# Patient Record
Sex: Female | Born: 1995
Health system: Southern US, Community
[De-identification: ages and names within clinical notes are randomized; demographics above are authoritative.]

## PROBLEM LIST (undated history)

## (undated) DIAGNOSIS — G43009 Migraine without aura, not intractable, without status migrainosus: Secondary | ICD-10-CM

## (undated) DIAGNOSIS — N926 Irregular menstruation, unspecified: Secondary | ICD-10-CM

## (undated) DIAGNOSIS — T7840XA Allergy, unspecified, initial encounter: Secondary | ICD-10-CM

## (undated) HISTORY — DX: Allergy, unspecified, initial encounter: T78.40XA

## (undated) HISTORY — DX: Migraine without aura, not intractable, without status migrainosus: G43.009

## (undated) HISTORY — PX: MOLE REMOVAL: SHX2046

## (undated) HISTORY — PX: COSMETIC SURGERY: SHX468

## (undated) HISTORY — DX: Irregular menstruation, unspecified: N92.6

---
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
## 2013-01-01 ENCOUNTER — Ambulatory Visit (INDEPENDENT_AMBULATORY_CARE_PROVIDER_SITE_OTHER): Payer: BC Managed Care – PPO | Admitting: Family Medicine

## 2013-01-01 VITALS — BP 128/74 | HR 122 | Temp 99.8°F | Resp 18 | Ht 65.5 in | Wt 150.6 lb

## 2013-01-01 DIAGNOSIS — R509 Fever, unspecified: Secondary | ICD-10-CM

## 2013-01-01 DIAGNOSIS — J029 Acute pharyngitis, unspecified: Secondary | ICD-10-CM

## 2013-01-01 MED ORDER — PENICILLIN V POTASSIUM 500 MG PO TABS: 500.0000 mg | ORAL_TABLET | Freq: Four times a day (QID) | ORAL | Status: DC

## 2013-01-01 NOTE — Progress Notes (Signed)
This is an 17 year old woman who just finished her first year at [employer]. She comes in with persistent sore throat for 5 days. She was originally seen [facility] the infirm[facility] college and a rapid strep was negative. Nevertheless, the sore throat continued and now she is running a fever over 100 and she vomited earlier today.  Patient has a significant past medical history of mono several years ago.  Patient has myalgia but no stiff neck, cough  Associated symptoms are mild headache, myalgia, one episode of vomiting, and loose stools  Objective: No acute distress HEENT: Unremarkable except for the red and mildly swollen throat, no exudates. Neck: Supple with enlarged bilateral anterior cervical nodes Chest: Clear Heart: Regular no murmur Skin: No rash  Assessment: Examination of swollen glands, fever, and reddened sore throat with a history in the past of mono, strongly suggests strep infection   Plan:Fever - Plan: penicillin v potassium (VEETID) 500 MG tablet, DISCONTINUED: penicillin v potassium (VEETID) 500 MG tablet  Sore throat - Plan: penicillin v potassium (VEETID) 500 MG tablet, DISCONTINUED: penicillin v potassium (VEETID) 500 MG tablet  Signed, Elvina Sidle, MD

## 2013-02-17 ENCOUNTER — Ambulatory Visit (INDEPENDENT_AMBULATORY_CARE_PROVIDER_SITE_OTHER): Payer: BC Managed Care – PPO | Admitting: Obstetrics & Gynecology

## 2013-02-17 ENCOUNTER — Ambulatory Visit: Payer: BC Managed Care – PPO

## 2013-02-17 ENCOUNTER — Encounter: Payer: Self-pay | Admitting: Obstetrics & Gynecology

## 2013-02-17 VITALS — BP 102/68 | HR 70 | Resp 16 | Ht 66.0 in | Wt 153.0 lb

## 2013-02-17 DIAGNOSIS — Z3040 Encounter for surveillance of contraceptives, unspecified: Secondary | ICD-10-CM

## 2013-02-17 MED ORDER — MEDROXYPROGESTERONE ACETATE 150 MG/ML IM SUSP
150.0000 mg | Freq: Once | INTRAMUSCULAR | Status: AC
  Administered 2013-02-17: 150 mg via INTRAMUSCULAR

## 2013-02-17 NOTE — Progress Notes (Signed)
Pt. Tolerated medication well informed to return in 12 weeks on or about 9-8 to 9-22.  AEX 12/13.

## 2013-04-20 ENCOUNTER — Ambulatory Visit (INDEPENDENT_AMBULATORY_CARE_PROVIDER_SITE_OTHER): Payer: BC Managed Care – PPO | Admitting: Family Medicine

## 2013-04-20 VITALS — BP 109/72 | HR 64 | Temp 98.2°F | Resp 16 | Ht 65.0 in | Wt 151.4 lb

## 2013-04-20 DIAGNOSIS — R300 Dysuria: Secondary | ICD-10-CM

## 2013-04-20 LAB — POCT URINALYSIS DIPSTICK
Ketones, UA: NEGATIVE
Protein, UA: NEGATIVE
Spec Grav, UA: 1.01
Urobilinogen, UA: 0.2

## 2013-04-20 LAB — POCT UA - MICROSCOPIC ONLY
Crystals, Ur, HPF, POC: NEGATIVE
Mucus, UA: NEGATIVE

## 2013-04-20 MED ORDER — NITROFURANTOIN MONOHYD MACRO 100 MG PO CAPS: 100.0000 mg | ORAL_CAPSULE | Freq: Two times a day (BID) | ORAL | Status: DC

## 2013-04-20 NOTE — Progress Notes (Signed)
Subjective:    Patient ID: Laura Andrews, female    DOB: 22-Mar-1996, 17 y.o.   MRN: [id number] Chief Complaint  Patient presents with  . Urinary Frequency    started this morning    HPI  Having a lot of dysuria and urinary frequency and urgency. No f/c, no n/v, no back pain, no c/d.  Has had a few UTIs prior but about a year since prior.   Is a swimmer and is sexually active.  No past medical history on file. Current Outpatient Prescriptions on File Prior to Visit  Medication Sig Dispense Refill  . medroxyPROGESTERone (DEPO-PROVERA) 150 MG/ML injection Inject 150 mg into the muscle every 3 (three) months.       No current facility-administered medications on file prior to visit.   Allergies  Allergen Reactions  . Omnicef [Cefdinir]     Colitis   . Latex Rash   Review of Systems  Constitutional: Negative for fever, chills, diaphoresis, activity change, appetite change, fatigue and unexpected weight change.  Gastrointestinal: Negative for abdominal pain, diarrhea, constipation, blood in stool, anal bleeding and rectal pain.  Genitourinary: Positive for dysuria, urgency and frequency. Negative for hematuria, flank pain, decreased urine volume, vaginal bleeding, vaginal discharge, enuresis, difficulty urinating, genital sores, vaginal pain, menstrual problem, pelvic pain and dyspareunia.  Musculoskeletal: Negative for gait problem.  Skin: Negative for rash.  Hematological: Negative for adenopathy.  Psychiatric/Behavioral: The patient is not nervous/anxious.       BP 109/72  Pulse 64  Temp(Src) 98.2 F (36.8 C) (Oral)  Resp 16  Ht 5\' 5"  (1.651 m)  Wt 151 lb 6.4 oz (68.675 kg)  BMI 25.19 kg/m2  SpO2 99% Objective:   Physical Exam  Constitutional: She is oriented to person, place, and time. She appears well-developed and well-nourished. No distress.  HENT:  Head: Normocephalic and atraumatic.  Cardiovascular: Normal rate, regular rhythm, normal heart sounds and intact distal  pulses.   Pulmonary/Chest: Effort normal and breath sounds normal.  Abdominal: Soft. Bowel sounds are normal. She exhibits no distension. There is no hepatosplenomegaly. There is no tenderness. There is no rebound, no guarding and no CVA tenderness.  Neurological: She is alert and oriented to person, place, and time.  Skin: Skin is warm and dry. She is not diaphoretic.  Psychiatric: She has a normal mood and affect. Her behavior is normal.           Results for orders placed in visit on 04/20/13  POCT URINE PREGNANCY      Result Value Range   Preg Test, Ur Negative    POCT URINALYSIS DIPSTICK      Result Value Range   Color, UA yellow     Clarity, UA clear     Glucose, UA neg     Bilirubin, UA neg     Ketones, UA neg     Spec Grav, UA 1.010     Blood, UA trace     pH, UA 7.0     Protein, UA neg     Urobilinogen, UA 0.2     Nitrite, UA neg     Leukocytes, UA small (1+)    POCT UA - MICROSCOPIC ONLY      Result Value Range   WBC, Ur, HPF, POC tntc     RBC, urine, microscopic 2-6     Bacteria, U Microscopic 1+     Mucus, UA neg     Epithelial cells, urine per micros 3-4  Crystals, Ur, HPF, POC neg     Casts, Ur, LPF, POC neg     Yeast, UA neg      Assessment & Plan:  Dysuria - Plan: POCT urine pregnancy, POCT urinalysis dipstick, POCT UA - Microscopic Only, Urine culture  Meds ordered this encounter  Medications  . nitrofurantoin, macrocrystal-monohydrate, (MACROBID) 100 MG capsule    Sig: Take 1 capsule (100 mg total) by mouth 2 (two) times daily.    Dispense:  14 capsule    Refill:  0

## 2013-04-20 NOTE — Patient Instructions (Addendum)
Urinary Tract Infection  Urinary tract infections (UTIs) can develop anywhere along your urinary tract. Your urinary tract is your body's drainage system for removing wastes and extra water. Your urinary tract includes two kidneys, two ureters, a bladder, and a urethra. Your kidneys are a pair of bean-shaped organs. Each kidney is about the size of your fist. They are located below your ribs, one on each side of your spine.  CAUSES  Infections are caused by microbes, which are microscopic organisms, including fungi, viruses, and bacteria. These organisms are so small that they can only be seen through a microscope. Bacteria are the microbes that most commonly cause UTIs.  SYMPTOMS   Symptoms of UTIs may vary by age and gender of the patient and by the location of the infection. Symptoms in young women typically include a frequent and intense urge to urinate and a painful, burning feeling in the bladder or urethra during urination. Older women and men are more likely to be tired, shaky, and weak and have muscle aches and abdominal pain. A fever may mean the infection is in your kidneys. Other symptoms of a kidney infection include pain in your back or sides below the ribs, nausea, and vomiting.  DIAGNOSIS  To diagnose a UTI, your caregiver will ask you about your symptoms. Your caregiver also will ask to provide a urine sample. The urine sample will be tested for bacteria and white blood cells. White blood cells are made by your body to help fight infection.  TREATMENT   Typically, UTIs can be treated with medication. Because most UTIs are caused by a bacterial infection, they usually can be treated with the use of antibiotics. The choice of antibiotic and length of treatment depend on your symptoms and the type of bacteria causing your infection.  HOME CARE INSTRUCTIONS   If you were prescribed antibiotics, take them exactly as your caregiver instructs you. Finish the medication even if you feel better after you  have only taken some of the medication.   Drink enough water and fluids to keep your urine clear or pale yellow.   Avoid caffeine, tea, and carbonated beverages. They tend to irritate your bladder.   Empty your bladder often. Avoid holding urine for long periods of time.   Empty your bladder before and after sexual intercourse.   After a bowel movement, women should cleanse from front to back. Use each tissue only once.  SEEK MEDICAL CARE IF:    You have back pain.   You develop a fever.   Your symptoms do not begin to resolve within 3 days.  SEEK IMMEDIATE MEDICAL CARE IF:    You have severe back pain or lower abdominal pain.   You develop chills.   You have nausea or vomiting.   You have continued burning or discomfort with urination.  MAKE SURE YOU:    Understand these instructions.   Will watch your condition.   Will get help right away if you are not doing well or get worse.  Document Released: 05/24/2005 Document Revised: 02/13/2012 Document Reviewed: 09/22/2011  [employer] Patient Information 2014 [employer].

## 2013-04-22 LAB — URINE CULTURE: Colony Count: 50000

## 2013-04-23 ENCOUNTER — Telehealth: Payer: Self-pay

## 2013-04-23 NOTE — Telephone Encounter (Signed)
Pts mom came by office to say(child at college)that her daughter is taking antibiotic for uti, and has now developed severe diarrhea with mucous,please  Advise.   Best phone for pt is [phone]   Pharmacy [facility] [place]

## 2013-04-24 NOTE — Telephone Encounter (Signed)
She is on macrobid. Lab results: Your urine culture returned actually NOT showing an infection. This could have been because your symptoms just started on the day you were seen. If you continue to have any urine/pelvic symptoms at all, please return to clinic for further eval. Discussed with Dr Clelia Croft and patient may d/c the antibiotic. She should return to clinic if urinary symptoms return. She should push fluids and rest. Patient advised.

## 2013-05-05 ENCOUNTER — Ambulatory Visit (INDEPENDENT_AMBULATORY_CARE_PROVIDER_SITE_OTHER): Payer: BC Managed Care – PPO

## 2013-05-05 VITALS — BP 94/60 | HR 60 | Resp 16 | Ht 65.0 in | Wt 155.0 lb

## 2013-05-05 DIAGNOSIS — Z3040 Encounter for surveillance of contraceptives, unspecified: Secondary | ICD-10-CM

## 2013-05-05 MED ORDER — MEDROXYPROGESTERONE ACETATE 150 MG/ML IM SUSP
150.0000 mg | Freq: Once | INTRAMUSCULAR | Status: AC
  Administered 2013-05-05: 150 mg via INTRAMUSCULAR

## 2013-05-05 NOTE — Progress Notes (Signed)
Pt tolerated injection well. Pt advised to RTO between 07-21-13 & 08-04-13 for next depo. Pt has aex set up for 1/15. Pt brought her own depo today

## 2013-05-14 ENCOUNTER — Ambulatory Visit (INDEPENDENT_AMBULATORY_CARE_PROVIDER_SITE_OTHER): Payer: BC Managed Care – PPO | Admitting: Physician Assistant

## 2013-05-14 VITALS — BP 110/70 | HR 81 | Temp 98.6°F | Resp 16 | Ht 66.0 in | Wt 151.2 lb

## 2013-05-14 DIAGNOSIS — N898 Other specified noninflammatory disorders of vagina: Secondary | ICD-10-CM

## 2013-05-14 DIAGNOSIS — B373 Candidiasis of vulva and vagina: Secondary | ICD-10-CM

## 2013-05-14 DIAGNOSIS — L293 Anogenital pruritus, unspecified: Secondary | ICD-10-CM

## 2013-05-14 LAB — POCT WET PREP WITH KOH
Clue Cells Wet Prep HPF POC: NEGATIVE
KOH Prep POC: POSITIVE
RBC Wet Prep HPF POC: NEGATIVE
Trichomonas, UA: NEGATIVE
Yeast Wet Prep HPF POC: NEGATIVE

## 2013-05-14 MED ORDER — FLUCONAZOLE 150 MG PO TABS: 150.0000 mg | ORAL_TABLET | Freq: Once | ORAL | Status: DC

## 2013-05-14 NOTE — Progress Notes (Signed)
  Subjective:    Patient ID: Laura Andrews, female    DOB: 24-Jul-1996, 17 y.o.   MRN: [id number]  HPI   Ms. Mangus is a very pleasant 17 yr old female here with concern for yeast infection.  Has had these before though it has been a couple years.  Has experienced about 3 days of vaginal itching, noted some increased discharge today.  No odor, no skin irritation.  Tried some vagisil to help relieve symptoms but has not tried otc treatment.  Was here about 2 wks ago for UTI, took course of macrobid, had significant diarrhea.  Urine culture subsequently had limited growth and urinary symptoms have resolved.  Pt states she has been very sensitive to antibiotics in the past - frequently severe GI SEs as well as yeast infections.  LMP 2012 -due to depo.  Sexually active with 1 female partner.  No concern for STIs, declines testing today.  Last tested Dec 2013.  Uses condoms about 75% of the time.  Denies dyspareunia or abnormal bleeding.    Review of Systems  Constitutional: Negative for fever and chills.  HENT: Negative.   Respiratory: Negative.   Cardiovascular: Negative.   Gastrointestinal: Negative.   Genitourinary: Positive for vaginal discharge. Negative for dysuria, urgency and frequency.  Musculoskeletal: Negative.   Skin: Negative.   Neurological: Negative.        Objective:   Physical Exam  Vitals reviewed. Constitutional: She is oriented to person, place, and time. She appears well-developed and well-nourished. No distress.  HENT:  Head: Normocephalic and atraumatic.  Eyes: Conjunctivae are normal. No scleral icterus.  Cardiovascular: Normal rate, regular rhythm and normal heart sounds.   Pulmonary/Chest: Effort normal. She has no wheezes. She has no rales.  Abdominal: Soft. Bowel sounds are normal. There is no tenderness.  Genitourinary: There is no rash, tenderness or lesion on the right labia. There is no rash, tenderness or lesion on the left labia. Right adnexum displays no mass,  no tenderness and no fullness. Left adnexum displays no mass, no tenderness and no fullness. Vaginal discharge found.  Neurological: She is alert and oriented to person, place, and time.  Skin: Skin is warm and dry.  Psychiatric: She has a normal mood and affect. Her behavior is normal.    Results for orders placed in visit on 05/14/13  POCT WET PREP WITH KOH      Result Value Range   Trichomonas, UA Negative     Clue Cells Wet Prep HPF POC neg     Epithelial Wet Prep HPF POC 0-1     Yeast Wet Prep HPF POC neg     Bacteria Wet Prep HPF POC 1+     RBC Wet Prep HPF POC neg     WBC Wet Prep HPF POC 0-2     KOH Prep POC Positive           Assessment & Plan:  Vaginal candidiasis  Vaginal itching - Plan: POCT Wet Prep with KOH   Ms. Dannenberg is a very pleasant 17 yr old female here with vaginal candidiasis.  Will treat with fluconazole 150mg  x 1, may repeat in 1 wk if still symptomatic.  RTC if worsening or not improving.

## 2013-05-14 NOTE — Patient Instructions (Signed)
Monilial Vaginitis  Vaginitis in a soreness, swelling and redness (inflammation) of the vagina and vulva. Monilial vaginitis is not a sexually transmitted infection.  CAUSES   Yeast vaginitis is caused by yeast (candida) that is normally found in your vagina. With a yeast infection, the candida has overgrown in number to a point that upsets the chemical balance.  SYMPTOMS   · White, thick vaginal discharge.  · Swelling, itching, redness and irritation of the vagina and possibly the lips of the vagina (vulva).  · Burning or painful urination.  · Painful intercourse.  DIAGNOSIS   Things that may contribute to monilial vaginitis are:  · Postmenopausal and virginal states.  · Pregnancy.  · Infections.  · Being tired, sick or stressed, especially if you had monilial vaginitis in the past.  · Diabetes. Good control will help lower the chance.  · Birth control pills.  · Tight fitting garments.  · Using bubble bath, feminine sprays, douches or deodorant tampons.  · Taking certain medications that kill germs (antibiotics).  · Sporadic recurrence can occur if you become ill.  TREATMENT   Your caregiver will give you medication.  · There are several kinds of anti monilial vaginal creams and suppositories specific for monilial vaginitis. For recurrent yeast infections, use a suppository or cream in the vagina 2 times a week, or as directed.  · Anti-monilial or steroid cream for the itching or irritation of the vulva may also be used. Get your caregiver's permission.  · Painting the vagina with methylene blue solution may help if the monilial cream does not work.  · Eating yogurt may help prevent monilial vaginitis.  HOME CARE INSTRUCTIONS   · Finish all medication as prescribed.  · Do not have sex until treatment is completed or after your caregiver tells you it is okay.  · Take warm sitz baths.  · Do not douche.  · Do not use tampons, especially scented ones.  · Wear cotton underwear.  · Avoid tight pants and panty  hose.  · Tell your sexual partner that you have a yeast infection. They should go to their caregiver if they have symptoms such as mild rash or itching.  · Your sexual partner should be treated as well if your infection is difficult to eliminate.  · Practice safer sex. Use condoms.  · Some vaginal medications cause latex condoms to fail. Vaginal medications that harm condoms are:  · Cleocin cream.  · Butoconazole (Femstat®).  · Terconazole (Terazol®) vaginal suppository.  · Miconazole (Monistat®) (may be purchased over the counter).  SEEK MEDICAL CARE IF:   · You have a temperature by mouth above 102° F (38.9° C).  · The infection is getting worse after 2 days of treatment.  · The infection is not getting better after 3 days of treatment.  · You develop blisters in or around your vagina.  · You develop vaginal bleeding, and it is not your menstrual period.  · You have pain when you urinate.  · You develop intestinal problems.  · You have pain with sexual intercourse.  Document Released: 05/24/2005 Document Revised: 11/06/2011 Document Reviewed: 02/05/2009  [employer]® Patient Information ©2014 [employer].

## 2013-06-17 ENCOUNTER — Other Ambulatory Visit: Payer: Self-pay | Admitting: Orthopedic Surgery

## 2013-06-17 DIAGNOSIS — M25562 Pain in left knee: Secondary | ICD-10-CM

## 2013-06-24 ENCOUNTER — Ambulatory Visit
Admission: RE | Admit: 2013-06-24 | Discharge: 2013-06-24 | Disposition: A | Discharge status: Home / Self Care | Payer: BC Managed Care – PPO | Source: Ambulatory Visit | Attending: Orthopedic Surgery | Admitting: Orthopedic Surgery

## 2013-06-24 DIAGNOSIS — M25562 Pain in left knee: Secondary | ICD-10-CM

## 2013-07-21 ENCOUNTER — Ambulatory Visit: Payer: BC Managed Care – PPO

## 2013-07-21 ENCOUNTER — Ambulatory Visit (INDEPENDENT_AMBULATORY_CARE_PROVIDER_SITE_OTHER): Payer: BC Managed Care – PPO

## 2013-07-21 VITALS — BP 118/68 | HR 60 | Resp 16 | Ht 65.5 in | Wt 148.6 lb

## 2013-07-21 DIAGNOSIS — Z3040 Encounter for surveillance of contraceptives, unspecified: Secondary | ICD-10-CM

## 2013-07-21 MED ORDER — MEDROXYPROGESTERONE ACETATE 150 MG/ML IM SUSP
150.0000 mg | Freq: Once | INTRAMUSCULAR | Status: AC
  Administered 2013-07-21: 150 mg via INTRAMUSCULAR

## 2013-07-21 NOTE — Progress Notes (Signed)
Patient here for depo provera 150mg , next injection due 10/06/13-10/20/13. Has AEX scheduled in 1/15. Patient tolerated well.

## 2013-08-26 ENCOUNTER — Ambulatory Visit: Payer: BC Managed Care – PPO

## 2013-09-02 ENCOUNTER — Ambulatory Visit (INDEPENDENT_AMBULATORY_CARE_PROVIDER_SITE_OTHER): Payer: BC Managed Care – PPO | Admitting: Nurse Practitioner

## 2013-09-02 ENCOUNTER — Encounter: Payer: Self-pay | Admitting: Nurse Practitioner

## 2013-09-02 VITALS — BP 106/72 | HR 72 | Ht 65.0 in | Wt 150.0 lb

## 2013-09-02 DIAGNOSIS — Z0000 Encounter for general adult medical examination without abnormal findings: Secondary | ICD-10-CM

## 2013-09-02 DIAGNOSIS — Z01419 Encounter for gynecological examination (general) (routine) without abnormal findings: Secondary | ICD-10-CM

## 2013-09-02 LAB — HEMOGLOBIN, FINGERSTICK: HEMOGLOBIN, FINGERSTICK: 14.3 g/dL (ref 12.0–16.0)

## 2013-09-02 MED ORDER — MEDROXYPROGESTERONE ACETATE 150 MG/ML IM SUSP: 150.0000 mg | INTRAMUSCULAR | Status: AC

## 2013-09-02 NOTE — Patient Instructions (Signed)
General topics  Next pap or exam is  due in 1 year Take a Women's multivitamin Take 1200 mg. of calcium daily - prefer dietary If any concerns in interim to call back  Breast Self-Awareness Practicing breast self-awareness may pick up problems early, prevent significant medical complications, and possibly save your life. By practicing breast self-awareness, you can become familiar with how your breasts look and feel and if your breasts are changing. This allows you to notice changes early. It can also offer you some reassurance that your breast health is good. One way to learn what is normal for your breasts and whether your breasts are changing is to do a breast self-exam. If you find a lump or something that was not present in the past, it is best to contact your caregiver right away. Other findings that should be evaluated by your caregiver include nipple discharge, especially if it is bloody; skin changes or reddening; areas where the skin seems to be pulled in (retracted); or new lumps and bumps. Breast pain is seldom associated with cancer (malignancy), but should also be evaluated by a caregiver. BREAST SELF-EXAM The best time to examine your breasts is 5 7 days after your menstrual period is over.  ExitCare Patient Information 2013 [employer].   Exercise to Stay Healthy Exercise helps you become and stay healthy. EXERCISE IDEAS AND TIPS Choose exercises that:  You enjoy.  Fit into your day. You do not need to exercise really hard to be healthy. You can do exercises at a slow or medium level and stay healthy. You can:  Stretch before and after working out.  Try yoga, Pilates, or tai chi.  Lift weights.  Walk fast, swim, jog, run, climb stairs, bicycle, dance, or rollerskate.  Take aerobic classes. Exercises that burn about 150 calories:  Running 1  miles in 15 minutes.  Playing volleyball for 45 to 60 minutes.  Washing and waxing a car for 45 to 60  minutes.  Playing touch football for 45 minutes.  Walking 1  miles in 35 minutes.  Pushing a stroller 1  miles in 30 minutes.  Playing basketball for 30 minutes.  Raking leaves for 30 minutes.  Bicycling 5 miles in 30 minutes.  Walking 2 miles in 30 minutes.  Dancing for 30 minutes.  Shoveling snow for 15 minutes.  Swimming laps for 20 minutes.  Walking up stairs for 15 minutes.  Bicycling 4 miles in 15 minutes.  Gardening for 30 to 45 minutes.  Jumping rope for 15 minutes.  Washing windows or floors for 45 to 60 minutes. Document Released: 09/16/2010 Document Revised: 11/06/2011 Document Reviewed: 09/16/2010 ExitCare Patient Information 2013 ExitCare[employer].   Other topics ( that may be useful information):    Sexually Transmitted Disease Sexually transmitted disease (STD) refers to any infection that is passed from person to person during sexual activity. This may happen by way of saliva, semen, blood, vaginal mucus, or urine. Common STDs include:  Gonorrhea.  Chlamydia.  Syphilis.  HIV/AIDS.  Genital herpes.  Hepatitis B and C.  Trichomonas.  Human papillomavirus (HPV).  Pubic lice. CAUSES  An STD may be spread by bacteria, virus, or parasite. A person can get an STD by:  Sexual intercourse with an infected person.  Sharing sex toys with an infected person.  Sharing needles with an infected person.  Having intimate contact with the genitals, mouth, or rectal areas of an infected person. SYMPTOMS  Some people may not have any symptoms, but   they can still pass the infection to others. Different STDs have different symptoms. Symptoms include:  Painful or bloody urination.  Pain in the pelvis, abdomen, vagina, anus, throat, or eyes.  Skin rash, itching, irritation, growths, or sores (lesions). These usually occur in the genital or anal area.  Abnormal vaginal discharge.  Penile discharge in men.  Soft, flesh-colored skin growths in the  genital or anal area.  Fever.  Pain or bleeding during sexual intercourse.  Swollen glands in the groin area.  Yellow skin and eyes (jaundice). This is seen with hepatitis. DIAGNOSIS  To make a diagnosis, your caregiver may:  Take a medical history.  Perform a physical exam.  Take a specimen (culture) to be examined.  Examine a sample of discharge under a microscope.  Perform blood test TREATMENT   Chlamydia, gonorrhea, trichomonas, and syphilis can be cured with antibiotic medicine.  Genital herpes, hepatitis, and HIV can be treated, but not cured, with prescribed medicines. The medicines will lessen the symptoms.  Genital warts from HPV can be treated with medicine or by freezing, burning (electrocautery), or surgery. Warts may come back.  HPV is a virus and cannot be cured with medicine or surgery.However, abnormal areas may be followed very closely by your caregiver and may be removed from the cervix, vagina, or vulva through office procedures or surgery. If your diagnosis is confirmed, your recent sexual partners need treatment. This is true even if they are symptom-free or have a negative culture or evaluation. They should not have sex until their caregiver says it is okay. HOME CARE INSTRUCTIONS  All sexual partners should be informed, tested, and treated for all STDs.  Take your antibiotics as directed. Finish them even if you start to feel better.  Only take over-the-counter or prescription medicines for pain, discomfort, or fever as directed by your caregiver.  Rest.  Eat a balanced diet and drink enough fluids to keep your urine clear or pale yellow.  Do not have sex until treatment is completed and you have followed up with your caregiver. STDs should be checked after treatment.  Keep all follow-up appointments, Pap tests, and blood tests as directed by your caregiver.  Only use latex condoms and water-soluble lubricants during sexual activity. Do not use  petroleum jelly or oils.  Avoid alcohol and illegal drugs.  Get vaccinated for HPV and hepatitis. If you have not received these vaccines in the past, talk to your caregiver about whether one or both might be right for you.  Avoid risky sex practices that can break the skin. The only way to avoid getting an STD is to avoid all sexual activity.Latex condoms and dental dams (for oral sex) will help lessen the risk of getting an STD, but will not completely eliminate the risk. SEEK MEDICAL CARE IF:   You have a fever.  You have any new or worsening symptoms. Document Released: 11/04/2002 Document Revised: 11/06/2011 Document Reviewed: 11/11/2010 [facility] Patient Information 2013 [place].    Domestic Abuse You are being battered or abused if someone close to you hits, pushes, or physically hurts you in any way. You also are being abused if you are forced into activities. You are being sexually abused if you are forced to have sexual contact of any kind. You are being emotionally abused if you are made to feel worthless or if you are constantly threatened. It is important to remember that help is available. No one has the right to abuse you. PREVENTION OF FURTHER  ABUSE  Learn the warning signs of danger. This varies with situations but may include: the use of alcohol, threats, isolation from friends and family, or forced sexual contact. Leave if you feel that violence is going to occur.  If you are attacked or beaten, report it to the police so the abuse is documented. You do not have to press charges. The police can protect you while you or the attackers are leaving. Get the officer's name and badge number and a copy of the report.  Find someone you can trust and tell them what is happening to you: your caregiver, a nurse, clergy member, close friend or family member. Feeling ashamed is natural, but remember that you have done nothing wrong. No one deserves abuse. Document Released:  08/11/2000 Document Revised: 11/06/2011 Document Reviewed: 10/20/2010 ExitCare Patient Information 2013 [employer]Care[employer].    How Much is Too Much Alcohol? Drinking too much alcohol can cause injury, accidents, and health problems. These types of problems can include:   Car crashes.  Falls.  Family fighting (domestic violence).  Drowning.  Fights.  Injuries.  Burns.  Damage to certain organs.  Having a baby with birth defects. ONE DRINK CAN BE TOO MUCH WHEN YOU ARE:  Working.  Pregnant or breastfeeding.  Taking medicines. Ask your doctor.  Driving or planning to drive. If you or someone you know has a drinking problem, get help from a doctor.  Document Released: 06/10/2009 Document Revised: 11/06/2011 Document Reviewed: 06/10/2009 ExitCare Patient Information 2013 ExitCare[employer].   Smoking Hazards Smoking cigarettes is extremely bad for your health. Tobacco smoke has over 200 known poisons in it. There are over 60 chemicals in tobacco smoke that cause cancer. Some of the chemicals found in cigarette smoke include:   Cyanide.  Benzene.  Formaldehyde.  Methanol (wood alcohol).  Acetylene (fuel used in welding torches).  Ammonia. Cigarette smoke also contains the poisonous gases nitrogen oxide and carbon monoxide.  Cigarette smokers have an increased risk of many serious medical problems and Smoking causes approximately:  90% of all lung cancer deaths in men.  80% of all lung cancer deaths in women.  90% of deaths from chronic obstructive lung disease. Compared with nonsmokers, smoking increases the risk of:  Coronary heart disease by 2 to 4 times.  Stroke by 2 to 4 times.  Men developing lung cancer by 23 times.  Women developing lung cancer by 13 times.  Dying from chronic obstructive lung diseases by 12 times.  . Smoking is the most preventable cause of death and disease in our society.  WHY IS SMOKING ADDICTIVE?  Nicotine is the chemical  agent in tobacco that is capable of causing addiction or dependence.  When you smoke and inhale, nicotine is absorbed rapidly into the bloodstream through your lungs. Nicotine absorbed through the lungs is capable of creating a powerful addiction. Both inhaled and non-inhaled nicotine may be addictive.  Addiction studies of cigarettes and spit tobacco show that addiction to nicotine occurs mainly during the teen years, when young people begin using tobacco products. WHAT ARE THE BENEFITS OF QUITTING?  There are many health benefits to quitting smoking.   Likelihood of developing cancer and heart disease decreases. Health improvements are seen almost immediately.  Blood pressure, pulse rate, and breathing patterns start returning to normal soon after quitting. QUITTING SMOKING   American Lung Association - 1-800-LUNGUSA  American Cancer Society - 1-800-ACS-2345 Document Released: 09/21/2004 Document Revised: 11/06/2011 Document Reviewed: 05/26/2009 ExitCare Patient Information 2013 ExitCare,   LLC.   Stress Management Stress is a state of physical or mental tension that often results from changes in your life or normal routine. Some common causes of stress are:  Death of a loved one.  Injuries or severe illnesses.  Getting fired or changing jobs.  Moving into a new home. Other causes may be:  Sexual problems.  Business or financial losses.  Taking on a large debt.  Regular conflict with someone at home or at work.  Constant tiredness from lack of sleep. It is not just bad things that are stressful. It may be stressful to:  Win the lottery.  Get married.  Buy a new car. The amount of stress that can be easily tolerated varies from person to person. Changes generally cause stress, regardless of the types of change. Too much stress can affect your health. It may lead to physical or emotional problems. Too little stress (boredom) may also become stressful. SUGGESTIONS TO  REDUCE STRESS:  Talk things over with your family and friends. It often is helpful to share your concerns and worries. If you feel your problem is serious, you may want to get help from a professional counselor.  Consider your problems one at a time instead of lumping them all together. Trying to take care of everything at once may seem impossible. List all the things you need to do and then start with the most important one. Set a goal to accomplish 2 or 3 things each day. If you expect to do too many in a single day you will naturally fail, causing you to feel even more stressed.  Do not use alcohol or drugs to relieve stress. Although you may feel better for a short time, they do not remove the problems that caused the stress. They can also be habit forming.  Exercise regularly - at least 3 times per week. Physical exercise can help to relieve that "uptight" feeling and will relax you.  The shortest distance between despair and hope is often a good night's sleep.  Go to bed and get up on time allowing yourself time for appointments without being rushed.  Take a short "time-out" period from any stressful situation that occurs during the day. Close your eyes and take some deep breaths. Starting with the muscles in your face, tense them, hold it for a few seconds, then relax. Repeat this with the muscles in your neck, shoulders, hand, stomach, back and legs.  Take good care of yourself. Eat a balanced diet and get plenty of rest.  Schedule time for having fun. Take a break from your daily routine to relax. HOME CARE INSTRUCTIONS   Call if you feel overwhelmed by your problems and feel you can no longer manage them on your own.  Return immediately if you feel like hurting yourself or someone else. Document Released: 02/07/2001 Document Revised: 11/06/2011 Document Reviewed: 09/30/2007 ExitCare Patient Information 2013 ExitCare, LLC.  

## 2013-09-02 NOTE — Progress Notes (Signed)
Patient ID: Laura PaddyCara Dede, female   DOB: [birth year], 18 y.o.   MRN: [id number] 18 y.o. G0P0 Single Caucasian Fe here for annual exam.  Likes Depo Provera. No bloating or PMS symptoms.  recent  STD's in October and was negative. Not SA since October.  Patient's last menstrual period was 07/18/2011.          Sexually active: yes  The current method of family planning is Depo-Provera injections.    Exercising: yes  Gym/ health club routine includes cardio.  Usually goes 3-4 times per week Smoker:  no  Health Maintenance: Pap: never Colonoscopy: 10/2012, normal, repeat age 18  TDaP:  2008  Gardasil:  completed in 2013 Labs:  HB: 14.3  Urine:  declined   reports that she has never smoked. She has never used smokeless tobacco. She reports that she does not drink alcohol or use illicit drugs.  Past Medical History  Diagnosis Date  . Irregular menses since age 18    Past Surgical History  Procedure Laterality Date  . Mole removal      left ear and right leg, performed by plastic surgeon under anesthesia  . Knee surgery      patella realignment and lateral release    Current Outpatient Prescriptions  Medication Sig Dispense Refill  . medroxyPROGESTERone (DEPO-PROVERA) 150 MG/ML injection Inject 1 mL (150 mg total) into the muscle every 3 (three) months.  1 mL  3   No current facility-administered medications for this visit.    History reviewed. No pertinent family history.  ROS:  Pertinent items are noted in HPI.  Otherwise, a comprehensive ROS was negative.  Exam:   BP 106/72  Pulse 72  Ht 5\' 5"  (1.651 m)  Wt 150 lb (68.04 kg)  BMI 24.96 kg/m2  LMP 07/18/2011 Height: 5\' 5"  (165.1 cm)  Ht Readings from Last 3 Encounters:  09/02/13 5\' 5"  (1.651 m) (61%*, Z = 0.28)  07/21/13 5' 5.5" (1.664 m) (68%*, Z = 0.48)  05/14/13 5\' 6"  (1.676 m) (75%*, Z = 0.67)   * Growth percentiles are based on CDC 2-20 Years data.    General appearance: alert, cooperative and appears stated  age Head: Normocephalic, without obvious abnormality, atraumatic Neck: no adenopathy, supple, symmetrical, trachea midline and thyroid normal to inspection and palpation Lungs: clear to auscultation bilaterally Breasts: normal appearance, no masses or tenderness Heart: regular rate and rhythm Abdomen: soft, non-tender; no masses,  no organomegaly Extremities: extremities normal, atraumatic, no cyanosis or edema Skin: Skin color, texture, turgor normal. No rashes or lesions Lymph nodes: Cervical, supraclavicular, and axillary nodes normal. No abnormal inguinal nodes palpated Neurologic: Grossly normal   Pelvic: External genitalia:  no lesions              Urethra:  normal appearing urethra with no masses, tenderness or lesions              Bartholin's and Skene's: normal                 Vagina: normal appearing vagina with normal color and discharge, no lesions              Cervix: anteverted              Pap taken: no Bimanual Exam:  Uterus:  normal size, contour, position, consistency, mobility, non-tender              Adnexa: no mass, fullness, tenderness  Rectovaginal: Confirms               Anus:  normal sphincter tone, no lesions  A:  Well Woman with normal exam  History of irregular menses  Depo Provera for contraception  P:   Pap smear as per guidelines Not done  Refilled Depo for another year  Counseled on breast self exam, STD prevention, adequate intake of calcium and vitamin D, diet and exercise return annually or prn  An After Visit Summary was printed and given to the patient.

## 2013-09-07 NOTE — Progress Notes (Signed)
Encounter reviewed by Dr. Brook Silva.  

## 2013-10-06 ENCOUNTER — Ambulatory Visit (INDEPENDENT_AMBULATORY_CARE_PROVIDER_SITE_OTHER): Payer: BC Managed Care – PPO

## 2013-10-06 VITALS — BP 106/58 | HR 64 | Resp 12 | Ht 65.5 in | Wt 146.8 lb

## 2013-10-06 DIAGNOSIS — Z3040 Encounter for surveillance of contraceptives, unspecified: Secondary | ICD-10-CM

## 2013-10-06 MED ORDER — MEDROXYPROGESTERONE ACETATE 150 MG/ML IM SUSP
150.0000 mg | Freq: Once | INTRAMUSCULAR | Status: AC
  Administered 2013-10-06: 150 mg via INTRAMUSCULAR

## 2013-10-06 NOTE — Progress Notes (Signed)
Patient here for depo provera 150mg  injection. Patient tolerated well. Next injection due 12/22/13-01/05/14.

## 2013-12-22 ENCOUNTER — Ambulatory Visit (INDEPENDENT_AMBULATORY_CARE_PROVIDER_SITE_OTHER): Payer: BC Managed Care – PPO | Admitting: Orthopedic Surgery

## 2013-12-22 VITALS — BP 102/68 | HR 76 | Wt 148.2 lb

## 2013-12-22 DIAGNOSIS — Z309 Encounter for contraceptive management, unspecified: Secondary | ICD-10-CM

## 2013-12-22 DIAGNOSIS — Z3040 Encounter for surveillance of contraceptives, unspecified: Secondary | ICD-10-CM

## 2013-12-22 MED ORDER — MEDROXYPROGESTERONE ACETATE 150 MG/ML IM SUSP
150.0000 mg | Freq: Once | INTRAMUSCULAR | Status: AC
  Administered 2013-12-22: 150 mg via INTRAMUSCULAR

## 2014-03-09 ENCOUNTER — Ambulatory Visit (INDEPENDENT_AMBULATORY_CARE_PROVIDER_SITE_OTHER): Payer: BC Managed Care – PPO | Admitting: *Deleted

## 2014-03-09 VITALS — BP 100/64 | HR 76 | Ht 65.0 in | Wt 148.0 lb

## 2014-03-09 DIAGNOSIS — Z3040 Encounter for surveillance of contraceptives, unspecified: Secondary | ICD-10-CM

## 2014-03-09 MED ORDER — MEDROXYPROGESTERONE ACETATE 150 MG/ML IM SUSP
150.0000 mg | Freq: Once | INTRAMUSCULAR | Status: AC
  Administered 2014-03-09: 150 mg via INTRAMUSCULAR

## 2014-03-09 NOTE — Patient Instructions (Addendum)
Please return between 05/25/14 and 06/08/14 for next injection.

## 2014-03-09 NOTE — Progress Notes (Signed)
Patient ID: [name], female   DOB: 1996/01/02, 18 y.o.   MRN: [id number] Pt arrived for Depo Provera injection.  Pt tolerated injection well in left gluteal. Last AEX - 09/02/13 Last Depo Provera Given - 12/22/13 Pt is within due dates. Pt should return between 05/25/14 and 06/08/14 for next injection.

## 2014-05-25 ENCOUNTER — Ambulatory Visit (INDEPENDENT_AMBULATORY_CARE_PROVIDER_SITE_OTHER): Payer: BC Managed Care – PPO | Admitting: *Deleted

## 2014-05-25 VITALS — BP 110/66 | HR 64 | Resp 12 | Ht 65.0 in | Wt 148.0 lb

## 2014-05-25 DIAGNOSIS — Z3040 Encounter for surveillance of contraceptives, unspecified: Secondary | ICD-10-CM

## 2014-05-25 MED ORDER — MEDROXYPROGESTERONE ACETATE 150 MG/ML IM SUSP
150.0000 mg | Freq: Once | INTRAMUSCULAR | Status: AC
  Administered 2014-05-25: 150 mg via INTRAMUSCULAR

## 2014-05-25 NOTE — Progress Notes (Signed)
Patient is within Depo Provera Calender Limits - Next Depo Due between: 12/14-12/28/15 Last AEX: 09/02/13 with Ms. Patty AEX Scheduled: 09/08/14  Patient is aware  Pt tolerated Injection well.

## 2014-08-12 ENCOUNTER — Ambulatory Visit (INDEPENDENT_AMBULATORY_CARE_PROVIDER_SITE_OTHER): Payer: BC Managed Care – PPO | Admitting: *Deleted

## 2014-08-12 VITALS — BP 106/60 | HR 68 | Resp 16 | Ht 65.0 in | Wt 150.0 lb

## 2014-08-12 DIAGNOSIS — Z3040 Encounter for surveillance of contraceptives, unspecified: Secondary | ICD-10-CM

## 2014-08-12 MED ORDER — MEDROXYPROGESTERONE ACETATE 150 MG/ML IM SUSP
150.0000 mg | Freq: Once | INTRAMUSCULAR | Status: AC
  Administered 2014-08-12: 150 mg via INTRAMUSCULAR

## 2014-08-12 NOTE — Progress Notes (Signed)
Patient in today for Depo Provera Injection. Patient tolerated shot well and denies any reactions with any shots in the past.  Last AEX 09/02/13 Next AEX 09/08/14. Advised pt to keep appt since she needs AEX done before next depo.  Last Depo 05/25/14. - Pt is on time today. Advised pt next depo due between 10/29/14 - 11/12/14. - Pt agreed.

## 2014-08-17 ENCOUNTER — Ambulatory Visit (INDEPENDENT_AMBULATORY_CARE_PROVIDER_SITE_OTHER): Payer: BC Managed Care – PPO | Admitting: Family Medicine

## 2014-08-17 VITALS — BP 122/74 | HR 112 | Temp 98.5°F | Resp 17 | Ht 66.0 in | Wt 152.0 lb

## 2014-08-17 DIAGNOSIS — M545 Low back pain: Secondary | ICD-10-CM

## 2014-08-17 DIAGNOSIS — J029 Acute pharyngitis, unspecified: Secondary | ICD-10-CM

## 2014-08-17 DIAGNOSIS — R1030 Lower abdominal pain, unspecified: Secondary | ICD-10-CM

## 2014-08-17 DIAGNOSIS — J020 Streptococcal pharyngitis: Secondary | ICD-10-CM

## 2014-08-17 LAB — POCT URINALYSIS DIPSTICK
Bilirubin, UA: NEGATIVE
Glucose, UA: NEGATIVE
Ketones, UA: NEGATIVE
NITRITE UA: NEGATIVE
PH UA: 7
PROTEIN UA: NEGATIVE
RBC UA: NEGATIVE
Spec Grav, UA: 1.015
UROBILINOGEN UA: 0.2

## 2014-08-17 LAB — POCT UA - MICROSCOPIC ONLY
CASTS, UR, LPF, POC: NEGATIVE
CRYSTALS, UR, HPF, POC: NEGATIVE
Mucus, UA: NEGATIVE
Yeast, UA: NEGATIVE

## 2014-08-17 LAB — POCT RAPID STREP A (OFFICE): RAPID STREP A SCREEN: POSITIVE — AB

## 2014-08-17 MED ORDER — AMOXICILLIN 500 MG PO CAPS: 500.0000 mg | ORAL_CAPSULE | Freq: Three times a day (TID) | ORAL | Status: DC

## 2014-08-17 NOTE — Patient Instructions (Addendum)
You do have strep throat - will start amoxicillin. Your urine test indicates a possible early infection, but will check a urine culture to further determine if this is an infection.  If you have more urinary symptoms or worse abdominal discomfort prior to call about your culture, call me as we may need to change antibiotic. Return to the clinic or go to the nearest emergency room if any of your symptoms worsen or new symptoms occur.  Strep Throat Strep throat is an infection of the throat caused by a bacteria named Streptococcus pyogenes. Your health care provider may call the infection streptococcal "tonsillitis" or "pharyngitis" depending on whether there are signs of inflammation in the tonsils or back of the throat. Strep throat is most common in children aged 5-15 years during the cold months of the year, but it can occur in people of any age during any season. This infection is spread from person to person (contagious) through coughing, sneezing, or other close contact. SIGNS AND SYMPTOMS   Fever or chills.  Painful, swollen, red tonsils or throat.  Pain or difficulty when swallowing.  White or yellow spots on the tonsils or throat.  Swollen, tender lymph nodes or "glands" of the neck or under the jaw.  Red rash all over the body (rare). DIAGNOSIS  Many different infections can cause the same symptoms. A test must be done to confirm the diagnosis so the right treatment can be given. A "rapid strep test" can help your health care provider make the diagnosis in a few minutes. If this test is not available, a light swab of the infected area can be used for a throat culture test. If a throat culture test is done, results are usually available in a day or two. TREATMENT  Strep throat is treated with antibiotic medicine. HOME CARE INSTRUCTIONS   Gargle with 1 tsp of salt in 1 cup of warm water, 3-4 times per day or as needed for comfort.  Family members who also have a sore throat or fever  should be tested for strep throat and treated with antibiotics if they have the strep infection.  Make sure everyone in your household washes their hands well.  Do not share food, drinking cups, or personal items that could cause the infection to spread to others.  You may need to eat a soft food diet until your sore throat gets better.  Drink enough water and fluids to keep your urine clear or pale yellow. This will help prevent dehydration.  Get plenty of rest.  Stay home from school, day care, or work until you have been on antibiotics for 24 hours.  Take medicines only as directed by your health care provider.  Take your antibiotic medicine as directed by your health care provider. Finish it even if you start to feel better. SEEK MEDICAL CARE IF:   The glands in your neck continue to enlarge.  You develop a rash, cough, or earache.  You cough up green, yellow-brown, or bloody sputum.  You have pain or discomfort not controlled by medicines.  Your problems seem to be getting worse rather than better.  You have a fever. SEEK IMMEDIATE MEDICAL CARE IF:   You develop any new symptoms such as vomiting, severe headache, stiff or painful neck, chest pain, shortness of breath, or trouble swallowing.  You develop severe throat pain, drooling, or changes in your voice.  You develop swelling of the neck, or the skin on the neck becomes red and tender.  You develop signs of dehydration, such as fatigue, dry mouth, and decreased urination.  You become increasingly sleepy, or you cannot wake up completely. MAKE SURE YOU:  Understand these instructions.  Will watch your condition.  Will get help right away if you are not doing well or get worse. Document Released: 08/11/2000 Document Revised: 12/29/2013 Document Reviewed: 10/13/2010 [facility] Patient Information 2015 [place], [place]. This information is not intended to replace advice given to you by your health care provider.  Make sure you discuss any questions you have with your health care provider.

## 2014-08-17 NOTE — Progress Notes (Addendum)
Subjective:  This chart was scribed for Laura StaggersJeffrey Chamya Hunton, MD by Jarvis Morganaylor Ferguson, Medical Scribe. This patient was seen in Room 10 and the patient's care was started at 8:18 AM.   Patient ID: Laura Andrews, female    DOB: 04-14-1996, 18 y.o.   MRN: [id number]  Chief Complaint  Patient presents with  . Sore Throat  . Chills  . fever last night  . Fatigue    HPI HPI Comments: Laura Andrews is a 18 y.o. female who presents to the [facility] complaining of an intermittent fever that began last night with a t-max of 102.3 F. She is complaining of associated generalized body aches, specifically in her lower back and sore throat. Pt took Nyquil last night with no relief. She denies any sick contacts. Pt has had strep before and states it feels like strep. She denies difficulty urinating, hematuria, urinary frequency, congestion, rhinorrhea or coughing.  There are no active problems to display for this patient.  Past Medical History  Diagnosis Date  . Irregular menses since age 18  . Allergy    Past Surgical History  Procedure Laterality Date  . Mole removal      left ear and right leg, performed by plastic surgeon under anesthesia  . Knee surgery      patella realignment and lateral release  . Cosmetic surgery     Allergies  Allergen Reactions  . Omnicef [Cefdinir]     Colitis   . Latex Rash   Prior to Admission medications   Medication Sig Start Date End Date Taking? Authorizing Provider  medroxyPROGESTERone (DEPO-PROVERA) 150 MG/ML injection Inject 1 mL (150 mg total) into the muscle every 3 (three) months. 09/02/13  Yes Laura FranklinPatricia Rolen-Grubb, FNP   History   Social History  . Marital Status: Single    Spouse Name: N/A    Number of Children: N/A  . Years of Education: N/A   Occupational History  . Not on file.   Social History Main Topics  . Smoking status: Never Smoker   . Smokeless tobacco: Never Used  . Alcohol Use: No  . Drug Use: No  . Sexual  Activity:    Partners: Male    Birth Control/ Protection: Injection   Other Topics Concern  . Not on file   Social History Narrative    Review of Systems  Constitutional: Positive for fever (102.3 F).  HENT: Positive for sore throat. Negative for congestion and rhinorrhea.   Respiratory: Negative for cough.   Genitourinary: Negative for dysuria, frequency, hematuria and difficulty urinating.  Musculoskeletal: Positive for myalgias and back pain (lower).  All other systems reviewed and are negative.      Objective:   Physical Exam  Constitutional: She is oriented to person, place, and time. She appears well-developed and well-nourished. No distress.  HENT:  Head: Normocephalic and atraumatic.  Right Ear: Hearing, tympanic membrane, external ear and ear canal normal.  Left Ear: Hearing, tympanic membrane, external ear and ear canal normal.  Nose: Nose normal.  Mouth/Throat: Oropharyngeal exudate and posterior oropharyngeal erythema present. No tonsillar abscesses.  Erythema on tonsils bilaterally  white exudate. Minimal hypertropy. No abscess. Slightly enlarged tender AC nodes  Eyes: Conjunctivae and EOM are normal. Pupils are equal, round, and reactive to light.  Cardiovascular: Normal rate, regular rhythm, normal heart sounds and intact distal pulses.   No murmur heard. Pulmonary/Chest: Effort normal and breath sounds normal. No respiratory distress. She has no wheezes. She has no  rhonchi.  Abdominal: Soft. There is tenderness (minimal suprapubic tenderness).  Musculoskeletal: She exhibits tenderness.  Paraspinal muscles minimally tender w/o spasm. No body tenderness. No CVA tenderness.  Neurological: She is alert and oriented to person, place, and time.  Skin: Skin is warm and dry. No rash noted.  Psychiatric: She has a normal mood and affect. Her behavior is normal.  Vitals reviewed.    Filed Vitals:   08/17/14 0810  BP: 122/74  Pulse: 112  Temp: 98.5 F (36.9 C)    TempSrc: Oral  Resp: 17  Height: 5\' 6"  (1.676 m)  Weight: 152 lb (68.947 kg)  SpO2: 99%    Results for orders placed or performed in visit on 08/17/14  POCT rapid strep A  Result Value Ref Range   Rapid Strep A Screen Positive (A) Negative  POCT urinalysis dipstick  Result Value Ref Range   Color, UA yellow    Clarity, UA cloudy    Glucose, UA neg    Bilirubin, UA neg    Ketones, UA neg    Spec Grav, UA 1.015    Blood, UA neg    pH, UA 7.0    Protein, UA neg    Urobilinogen, UA 0.2    Nitrite, UA neg    Leukocytes, UA moderate (2+)   POCT UA - Microscopic Only  Result Value Ref Range   WBC, Ur, HPF, POC 2-5    RBC, urine, microscopic 0-1    Bacteria, U Microscopic trace    Mucus, UA neg    Epithelial cells, urine per micros 2-4    Crystals, Ur, HPF, POC neg    Casts, Ur, LPF, POC neg    Yeast, UA neg        Assessment & Plan:   Kelcie Currie is a 18 y.o. female Sore throat - Plan: POCT rapid strep A  Low back pain without sciatica, unspecified back pain laterality - Plan: POCT urinalysis dipstick, POCT UA - Microscopic Only  Suprapubic abdominal pain, unspecified laterality - Plan: POCT urinalysis dipstick, POCT UA - Microscopic Only  Strep pharyngitis - start amoxicillin, sx care with throat lozenges, tylenol or motrin. rtc precautions.   POssible early UTI with suprapubic abd tenderness on exam only, but no other urinary sx's, and not CVAT  -will be on amox as above, push fluids, and check urine culture. If sx's worsen prior to cx and sensitivities - call and may need to add or change abx. rtc precautions discussed.   Meds ordered this encounter  Medications  . amoxicillin (AMOXIL) 500 MG capsule    Sig: Take 1 capsule (500 mg total) by mouth 3 (three) times daily.    Dispense:  30 capsule    Refill:  0   Patient Instructions  You do have strep throat - will start amoxicillin. Your urine test indicates a possible early infection, but will check a urine  culture to further determine if this is an infection.  If you have more urinary symptoms or worse abdominal discomfort prior to call about your culture, call me as we may need to change antibiotic. Return to the clinic or go to the nearest emergency room if any of your symptoms worsen or new symptoms occur.  Strep Throat Strep throat is an infection of the throat caused by a bacteria named Streptococcus pyogenes. Your health care provider may call the infection streptococcal "tonsillitis" or "pharyngitis" depending on whether there are signs of inflammation in the tonsils or back of the  throat. Strep throat is most common in children aged 5-15 years during the cold months of the year, but it can occur in people of any age during any season. This infection is spread from person to person (contagious) through coughing, sneezing, or other close contact. SIGNS AND SYMPTOMS   Fever or chills.  Painful, swollen, red tonsils or throat.  Pain or difficulty when swallowing.  White or yellow spots on the tonsils or throat.  Swollen, tender lymph nodes or "glands" of the neck or under the jaw.  Red rash all over the body (rare). DIAGNOSIS  Many different infections can cause the same symptoms. A test must be done to confirm the diagnosis so the right treatment can be given. A "rapid strep test" can help your health care provider make the diagnosis in a few minutes. If this test is not available, a light swab of the infected area can be used for a throat culture test. If a throat culture test is done, results are usually available in a day or two. TREATMENT  Strep throat is treated with antibiotic medicine. HOME CARE INSTRUCTIONS   Gargle with 1 tsp of salt in 1 cup of warm water, 3-4 times per day or as needed for comfort.  Family members who also have a sore throat or fever should be tested for strep throat and treated with antibiotics if they have the strep infection.  Make sure everyone in your  household washes their hands well.  Do not share food, drinking cups, or personal items that could cause the infection to spread to others.  You may need to eat a soft food diet until your sore throat gets better.  Drink enough water and fluids to keep your urine clear or pale yellow. This will help prevent dehydration.  Get plenty of rest.  Stay home from school, day care, or work until you have been on antibiotics for 24 hours.  Take medicines only as directed by your health care provider.  Take your antibiotic medicine as directed by your health care provider. Finish it even if you start to feel better. SEEK MEDICAL CARE IF:   The glands in your neck continue to enlarge.  You develop a rash, cough, or earache.  You cough up green, yellow-brown, or bloody sputum.  You have pain or discomfort not controlled by medicines.  Your problems seem to be getting worse rather than better.  You have a fever. SEEK IMMEDIATE MEDICAL CARE IF:   You develop any new symptoms such as vomiting, severe headache, stiff or painful neck, chest pain, shortness of breath, or trouble swallowing.  You develop severe throat pain, drooling, or changes in your voice.  You develop swelling of the neck, or the skin on the neck becomes red and tender.  You develop signs of dehydration, such as fatigue, dry mouth, and decreased urination.  You become increasingly sleepy, or you cannot wake up completely. MAKE SURE YOU:  Understand these instructions.  Will watch your condition.  Will get help right away if you are not doing well or get worse. Document Released: 08/11/2000 Document Revised: 12/29/2013 Document Reviewed: 10/13/2010 [facility] Patient Information 2015 [place], [place]. This information is not intended to replace advice given to you by your health care provider. Make sure you discuss any questions you have with your health care provider.       I personally performed the services  described in this documentation, which was scribed in my presence. The recorded information has been  reviewed and considered, and addended by me as needed.

## 2014-08-18 LAB — URINE CULTURE

## 2014-09-08 ENCOUNTER — Ambulatory Visit: Payer: BC Managed Care – PPO | Admitting: Nurse Practitioner

## 2014-09-28 HISTORY — PX: WISDOM TOOTH EXTRACTION: SHX21

## 2014-11-02 ENCOUNTER — Ambulatory Visit (INDEPENDENT_AMBULATORY_CARE_PROVIDER_SITE_OTHER): Payer: BLUE CROSS/BLUE SHIELD | Admitting: Certified Nurse Midwife

## 2014-11-02 ENCOUNTER — Encounter: Payer: Self-pay | Admitting: Certified Nurse Midwife

## 2014-11-02 ENCOUNTER — Telehealth: Payer: Self-pay | Admitting: Nurse Practitioner

## 2014-11-02 VITALS — BP 102/64 | HR 68 | Resp 16 | Ht 65.0 in | Wt 147.0 lb

## 2014-11-02 DIAGNOSIS — Z3042 Encounter for surveillance of injectable contraceptive: Secondary | ICD-10-CM | POA: Diagnosis not present

## 2014-11-02 NOTE — Telephone Encounter (Signed)
Pt on depo shot - on for 4 years with no issues. Pt states having significant spotting, window is march 5-18. Pt leaving country tomorrow.

## 2014-11-02 NOTE — Progress Notes (Signed)
18 y.o. Single Caucasian [id number] for evaluation of Depo Provera  initiated on 09/12/13 for contraception. Patient has not had bleeding or spotting since being on Depo Provera since 2012. Started spotting 24 hours ago and now seeing dark blood scant amount, has used only one tampon. No cramping. Patient was concerned. Sexually active with no condom use. No pelvic pain or STD concerns. [place] tomorrow. Patient has aex scheduled on 11/11/14 and will renew Depo Provera then.  O: Healthy female, WD WN Affect: normal orientation X 3  Skin warm and dry.  Abdomen: soft, non tender External genital: normal female, no lesions Vagina scant blood noted, slight dark blood Cervix: normal, non tender, no blood noted from cervix, no lesions Uterus: normal, non tender,  Adnexa: normal, non tender, no fullness or masses POCT UPT negative   A: Normal pelvic exam Normal bleeding profile for Depo Provera use Annual exam over due has scheduled  P: Discussed finding of normal pelvic exam. Discussed bleeding profile expectation and with age maturity she may have some bleeding with Depo Provera use which is normal. Patient relieved and reassured.  RV prn, aex with Depo Provera update

## 2014-11-02 NOTE — Telephone Encounter (Signed)
Spoke with patient. Patient states that she has been receiving the Depo injection for 4 years. "I never have spotting but I always get my shot on the very first day of the window. This month I am getting it at the end because I am traveling. I had some spotting yesterday and just want to make sure this is okay." Advised patient that spotting with Depo is not abnormal and it very common. Advised if spotting continues and is bothersome to patient can be seen to discuss other birth control options. Patient is agreeable.  Routing to provider for final review. Patient agreeable to disposition. Will close encounter

## 2014-11-02 NOTE — Telephone Encounter (Signed)
Spoke with patient after speaking with Joy, CMA. Patient has appointment today to discuss bleeding with Depo with Laura Andrews CNM. Patient has aex scheduled for 3/18 with Laura FranklinPatricia Rolen-Grubb, FNP. Last depo was given 12/16. Patient is due for next depo from 3/2-3/17. Advised patient will be one day late for depo if she decides to continue after today's appointment. Will need to move that appointment up. Patient is agreeable and requests appointment for 3/16. Appointment scheduled for 3/16 at 10am with Laura Andrews CNM as Laura FranklinPatricia Rolen-Grubb, FNP will be out of the office. Patient is agreeable to date and time. Advised if decides to discontinue depo at this appointment will still need to keep annual just will not get depo. Patient is agreeable.  Encounter previously closed. Routing to Laura Andrews CNM as Lorain ChildesFYI.

## 2014-11-02 NOTE — Telephone Encounter (Signed)
Spoke with patient at time of incoming call. Patient states that she has now started bleeding "Like a cycle. It just started and this never happens with my depo." Patient is concerned and would like to be seen before going out of town tomorrow. Appointment scheduled for today at 12:45pm with Verner Choleborah S. Leonard CNM. Patient is agreeable to date and time.  Routing to provider for final review. Patient agreeable to disposition. Will close encounter

## 2014-11-02 NOTE — Patient Instructions (Signed)

## 2014-11-03 NOTE — Progress Notes (Signed)
Reviewed personally.  M. Suzanne Sherrey North, MD.  

## 2014-11-04 ENCOUNTER — Ambulatory Visit: Payer: Self-pay | Admitting: Nurse Practitioner

## 2014-11-11 ENCOUNTER — Ambulatory Visit (INDEPENDENT_AMBULATORY_CARE_PROVIDER_SITE_OTHER): Payer: BLUE CROSS/BLUE SHIELD | Admitting: Certified Nurse Midwife

## 2014-11-11 ENCOUNTER — Encounter: Payer: Self-pay | Admitting: Certified Nurse Midwife

## 2014-11-11 VITALS — BP 100/66 | HR 86 | Ht 65.0 in | Wt 151.4 lb

## 2014-11-11 DIAGNOSIS — Z0000 Encounter for general adult medical examination without abnormal findings: Secondary | ICD-10-CM | POA: Diagnosis not present

## 2014-11-11 DIAGNOSIS — Z3040 Encounter for surveillance of contraceptives, unspecified: Secondary | ICD-10-CM | POA: Diagnosis not present

## 2014-11-11 DIAGNOSIS — Z3042 Encounter for surveillance of injectable contraceptive: Secondary | ICD-10-CM | POA: Diagnosis not present

## 2014-11-11 DIAGNOSIS — Z01419 Encounter for gynecological examination (general) (routine) without abnormal findings: Secondary | ICD-10-CM | POA: Diagnosis not present

## 2014-11-11 LAB — HEMOGLOBIN, FINGERSTICK: Hemoglobin, fingerstick: 14.6 g/dL (ref 12.0–16.0)

## 2014-11-11 LAB — POCT URINALYSIS DIPSTICK
Leukocytes, UA: NEGATIVE
PH UA: 5
Urobilinogen, UA: NEGATIVE

## 2014-11-11 MED ORDER — MEDROXYPROGESTERONE ACETATE 150 MG/ML IM SUSP
150.0000 mg | Freq: Once | INTRAMUSCULAR | Status: AC
  Administered 2014-11-11: 150 mg via INTRAMUSCULAR

## 2014-11-11 NOTE — Progress Notes (Signed)
Reviewed personally.  M. Suzanne Rania Prothero, MD.  

## 2014-11-11 NOTE — Patient Instructions (Signed)
General topics  Next pap or exam is  due in 1 year Take a Women's multivitamin Take 1200 mg. of calcium daily - prefer dietary If any concerns in interim to call back  Breast Self-Awareness Practicing breast self-awareness may pick up problems early, prevent significant medical complications, and possibly save your life. By practicing breast self-awareness, you can become familiar with how your breasts look and feel and if your breasts are changing. This allows you to notice changes early. It can also offer you some reassurance that your breast health is good. One way to learn what is normal for your breasts and whether your breasts are changing is to do a breast self-exam. If you find a lump or something that was not present in the past, it is best to contact your caregiver right away. Other findings that should be evaluated by your caregiver include nipple discharge, especially if it is bloody; skin changes or reddening; areas where the skin seems to be pulled in (retracted); or new lumps and bumps. Breast pain is seldom associated with cancer (malignancy), but should also be evaluated by a caregiver. BREAST SELF-EXAM The best time to examine your breasts is 5 7 days after your menstrual period is over.  ExitCare Patient Information 2013 [employer].   Exercise to Stay Healthy Exercise helps you become and stay healthy. EXERCISE IDEAS AND TIPS Choose exercises that:  You enjoy.  Fit into your day. You do not need to exercise really hard to be healthy. You can do exercises at a slow or medium level and stay healthy. You can:  Stretch before and after working out.  Try yoga, Pilates, or tai chi.  Lift weights.  Walk fast, swim, jog, run, climb stairs, bicycle, dance, or rollerskate.  Take aerobic classes. Exercises that burn about 150 calories:  Running 1  miles in 15 minutes.  Playing volleyball for 45 to 60 minutes.  Washing and waxing a car for 45 to 60  minutes.  Playing touch football for 45 minutes.  Walking 1  miles in 35 minutes.  Pushing a stroller 1  miles in 30 minutes.  Playing basketball for 30 minutes.  Raking leaves for 30 minutes.  Bicycling 5 miles in 30 minutes.  Walking 2 miles in 30 minutes.  Dancing for 30 minutes.  Shoveling snow for 15 minutes.  Swimming laps for 20 minutes.  Walking up stairs for 15 minutes.  Bicycling 4 miles in 15 minutes.  Gardening for 30 to 45 minutes.  Jumping rope for 15 minutes.  Washing windows or floors for 45 to 60 minutes. Document Released: 09/16/2010 Document Revised: 11/06/2011 Document Reviewed: 09/16/2010 ExitCare Patient Information 2013 [employer].   Other topics ( that may be useful information):    Sexually Transmitted Disease Sexually transmitted disease (STD) refers to any infection that is passed from person to person during sexual activity. This may happen by way of saliva, semen, blood, vaginal mucus, or urine. Common STDs include:  Gonorrhea.  Chlamydia.  Syphilis.  HIV/AIDS.  Genital herpes.  Hepatitis B and C.  Trichomonas.  Human papillomavirus (HPV).  Pubic lice. CAUSES  An STD may be spread by bacteria, virus, or parasite. A person can get an STD by:  Sexual intercourse with an infected person.  Sharing sex toys with an infected person.  Sharing needles with an infected person.  Having intimate contact with the genitals, mouth, or rectal areas of an infected person. SYMPTOMS  Some people may not have any symptoms, but   they can still pass the infection to others. Different STDs have different symptoms. Symptoms include:  Painful or bloody urination.  Pain in the pelvis, abdomen, vagina, anus, throat, or eyes.  Skin rash, itching, irritation, growths, or sores (lesions). These usually occur in the genital or anal area.  Abnormal vaginal discharge.  Penile discharge in men.  Soft, flesh-colored skin growths in the  genital or anal area.  Fever.  Pain or bleeding during sexual intercourse.  Swollen glands in the groin area.  Yellow skin and eyes (jaundice). This is seen with hepatitis. DIAGNOSIS  To make a diagnosis, your caregiver may:  Take a medical history.  Perform a physical exam.  Take a specimen (culture) to be examined.  Examine a sample of discharge under a microscope.  Perform blood test TREATMENT   Chlamydia, gonorrhea, trichomonas, and syphilis can be cured with antibiotic medicine.  Genital herpes, hepatitis, and HIV can be treated, but not cured, with prescribed medicines. The medicines will lessen the symptoms.  Genital warts from HPV can be treated with medicine or by freezing, burning (electrocautery), or surgery. Warts may come back.  HPV is a virus and cannot be cured with medicine or surgery.However, abnormal areas may be followed very closely by your caregiver and may be removed from the cervix, vagina, or vulva through office procedures or surgery. If your diagnosis is confirmed, your recent sexual partners need treatment. This is true even if they are symptom-free or have a negative culture or evaluation. They should not have sex until their caregiver says it is okay. HOME CARE INSTRUCTIONS  All sexual partners should be informed, tested, and treated for all STDs.  Take your antibiotics as directed. Finish them even if you start to feel better.  Only take over-the-counter or prescription medicines for pain, discomfort, or fever as directed by your caregiver.  Rest.  Eat a balanced diet and drink enough fluids to keep your urine clear or pale yellow.  Do not have sex until treatment is completed and you have followed up with your caregiver. STDs should be checked after treatment.  Keep all follow-up appointments, Pap tests, and blood tests as directed by your caregiver.  Only use latex condoms and water-soluble lubricants during sexual activity. Do not use  petroleum jelly or oils.  Avoid alcohol and illegal drugs.  Get vaccinated for HPV and hepatitis. If you have not received these vaccines in the past, talk to your caregiver about whether one or both might be right for you.  Avoid risky sex practices that can break the skin. The only way to avoid getting an STD is to avoid all sexual activity.Latex condoms and dental dams (for oral sex) will help lessen the risk of getting an STD, but will not completely eliminate the risk. SEEK MEDICAL CARE IF:   You have a fever.  You have any new or worsening symptoms. Document Released: 11/04/2002 Document Revised: 11/06/2011 Document Reviewed: 11/11/2010 [facility] Patient Information 2013 [employer].    Domestic Abuse You are being battered or abused if someone close to you hits, pushes, or physically hurts you in any way. You also are being abused if you are forced into activities. You are being sexually abused if you are forced to have sexual contact of any kind. You are being emotionally abused if you are made to feel worthless or if you are constantly threatened. It is important to remember that help is available. No one has the right to abuse you. PREVENTION OF FURTHER  ABUSE  Learn the warning signs of danger. This varies with situations but may include: the use of alcohol, threats, isolation from friends and family, or forced sexual contact. Leave if you feel that violence is going to occur.  If you are attacked or beaten, report it to the police so the abuse is documented. You do not have to press charges. The police can protect you while you or the attackers are leaving. Get the officer's name and badge number and a copy of the report.  Find someone you can trust and tell them what is happening to you: your caregiver, a nurse, clergy member, close friend or family member. Feeling ashamed is natural, but remember that you have done nothing wrong. No one deserves abuse. Document Released:  08/11/2000 Document Revised: 11/06/2011 Document Reviewed: 10/20/2010 ExitCare Patient Information 2013 [employer].    How Much is Too Much Alcohol? Drinking too much alcohol can cause injury, accidents, and health problems. These types of problems can include:   Car crashes.  Falls.  Family fighting (domestic violence).  Drowning.  Fights.  Injuries.  Burns.  Damage to certain organs.  Having a baby with birth defects. ONE DRINK CAN BE TOO MUCH WHEN YOU ARE:  Working.  Pregnant or breastfeeding.  Taking medicines. Ask your doctor.  Driving or planning to drive. If you or someone you know has a drinking problem, get help from a doctor.  Document Released: 06/10/2009 Document Revised: 11/06/2011 Document Reviewed: 06/10/2009 ExitCare Patient Information 2013 [employer].   Smoking Hazards Smoking cigarettes is extremely bad for your health. Tobacco smoke has over 200 known poisons in it. There are over 60 chemicals in tobacco smoke that cause cancer. Some of the chemicals found in cigarette smoke include:   Cyanide.  Benzene.  Formaldehyde.  Methanol (wood alcohol).  Acetylene (fuel used in welding torches).  Ammonia. Cigarette smoke also contains the poisonous gases nitrogen oxide and carbon monoxide.  Cigarette smokers have an increased risk of many serious medical problems and Smoking causes approximately:  90% of all lung cancer deaths in men.  80% of all lung cancer deaths in women.  90% of deaths from chronic obstructive lung disease. Compared with nonsmokers, smoking increases the risk of:  Coronary heart disease by 2 to 4 times.  Stroke by 2 to 4 times.  Men developing lung cancer by 23 times.  Women developing lung cancer by 13 times.  Dying from chronic obstructive lung diseases by 12 times.  . Smoking is the most preventable cause of death and disease in our society.  WHY IS SMOKING ADDICTIVE?  Nicotine is the chemical  agent in tobacco that is capable of causing addiction or dependence.  When you smoke and inhale, nicotine is absorbed rapidly into the bloodstream through your lungs. Nicotine absorbed through the lungs is capable of creating a powerful addiction. Both inhaled and non-inhaled nicotine may be addictive.  Addiction studies of cigarettes and spit tobacco show that addiction to nicotine occurs mainly during the teen years, when young people begin using tobacco products. WHAT ARE THE BENEFITS OF QUITTING?  There are many health benefits to quitting smoking.   Likelihood of developing cancer and heart disease decreases. Health improvements are seen almost immediately.  Blood pressure, pulse rate, and breathing patterns start returning to normal soon after quitting. QUITTING SMOKING   American Lung Association - 1-800-LUNGUSA  American Cancer Society - 1-800-ACS-2345 Document Released: 09/21/2004 Document Revised: 11/06/2011 Document Reviewed: 05/26/2009 ExitCare Patient Information 2013 ExitCare[employer]   LLC.   Stress Management Stress is a state of physical or mental tension that often results from changes in your life or normal routine. Some common causes of stress are:  Death of a loved one.  Injuries or severe illnesses.  Getting fired or changing jobs.  Moving into a new home. Other causes may be:  Sexual problems.  Business or financial losses.  Taking on a large debt.  Regular conflict with someone at home or at work.  Constant tiredness from lack of sleep. It is not just bad things that are stressful. It may be stressful to:  Win the lottery.  Get married.  Buy a new car. The amount of stress that can be easily tolerated varies from person to person. Changes generally cause stress, regardless of the types of change. Too much stress can affect your health. It may lead to physical or emotional problems. Too little stress (boredom) may also become stressful. SUGGESTIONS TO  REDUCE STRESS:  Talk things over with your family and friends. It often is helpful to share your concerns and worries. If you feel your problem is serious, you may want to get help from a professional counselor.  Consider your problems one at a time instead of lumping them all together. Trying to take care of everything at once may seem impossible. List all the things you need to do and then start with the most important one. Set a goal to accomplish 2 or 3 things each day. If you expect to do too many in a single day you will naturally fail, causing you to feel even more stressed.  Do not use alcohol or drugs to relieve stress. Although you may feel better for a short time, they do not remove the problems that caused the stress. They can also be habit forming.  Exercise regularly - at least 3 times per week. Physical exercise can help to relieve that "uptight" feeling and will relax you.  The shortest distance between despair and hope is often a good night's sleep.  Go to bed and get up on time allowing yourself time for appointments without being rushed.  Take a short "time-out" period from any stressful situation that occurs during the day. Close your eyes and take some deep breaths. Starting with the muscles in your face, tense them, hold it for a few seconds, then relax. Repeat this with the muscles in your neck, shoulders, hand, stomach, back and legs.  Take good care of yourself. Eat a balanced diet and get plenty of rest.  Schedule time for having fun. Take a break from your daily routine to relax. HOME CARE INSTRUCTIONS   Call if you feel overwhelmed by your problems and feel you can no longer manage them on your own.  Return immediately if you feel like hurting yourself or someone else. Document Released: 02/07/2001 Document Revised: 11/06/2011 Document Reviewed: 09/30/2007 ExitCare Patient Information 2013 ExitCare[employer].   

## 2014-11-11 NOTE — Progress Notes (Signed)
Patient is also here for Depo Provera Injection  Given in Right Ventrogluteal Next Depo Due Between 6/1-6/15/16

## 2014-11-11 NOTE — Progress Notes (Signed)
19 y.o. G0P0 Single  Caucasian Fe here for annual exam. Periods only once with Depo. Happy with choice, would like to continue. No partner change, no STD screening desired. No health issues today. Just returned from [place] for spring break! Will graduate from college in December. Depo due today.  Patient's last menstrual period was 11/01/2014.          Sexually active: Yes.    The current method of family planning is Depo-Provera injections.    Exercising: Yes.    cardio 3-4x/wk Smoker:  no  Health Maintenance: Pap:  Never had one BSE: no TDaP:  2009 Labs: Hgb: 14.6 ; Urine: Negative   reports that she has never smoked. She has never used smokeless tobacco. She reports that she drinks about 0.6 - 1.2 oz of alcohol per week. She reports that she does not use illicit drugs.  Past Medical History  Diagnosis Date  . Irregular menses since age 89  . Allergy     Past Surgical History  Procedure Laterality Date  . Mole removal      left ear and right leg, performed by plastic surgeon under anesthesia  . Knee surgery      patella realignment and lateral release  . Cosmetic surgery      Current Outpatient Prescriptions  Medication Sig Dispense Refill  . medroxyPROGESTERone (DEPO-PROVERA) 150 MG/ML injection Inject 1 mL (150 mg total) into the muscle every 3 (three) months. 1 mL 3   No current facility-administered medications for this visit.    No family history on file.  ROS:  Pertinent items are noted in HPI.  Otherwise, a comprehensive ROS was negative.  Exam:   Ht  (1.651 m)  Wt 151 lb 6.4 oz (68.675 kg)  BMI 25.19 kg/m2  LMP 11/01/2014 Height:  (165.1 cm) Ht Readings from Last 3 Encounters:  11/11/14  (1.651 m)  11/02/14  (1.651 m)  08/17/14  (1.676 m)    General appearance: alert, cooperative and appears stated age Head: Normocephalic, without obvious abnormality, atraumatic Neck: no adenopathy, supple, symmetrical, trachea midline  and thyroid normal to inspection and palpation Lungs: clear to auscultation bilaterally Breasts: normal appearance, no masses or tenderness, No nipple retraction or dimpling, No nipple discharge or bleeding, No axillary or supraclavicular adenopathy Heart: regular rate and rhythm Abdomen: soft, non-tender; no masses,  no organomegaly Extremities: extremities normal, atraumatic, no cyanosis or edema Skin: Skin color, texture, turgor normal. No rashes or lesions Lymph nodes: Cervical, supraclavicular, and axillary nodes normal. No abnormal inguinal nodes palpated Neurologic: Grossly normal   Pelvic: External genitalia:  no lesions              Urethra:  normal appearing urethra with no masses, tenderness or lesions              Bartholin's and Skene's: normal                 Vagina: normal appearing vagina with normal color and discharge, no lesions              Cervix: normal, nonn tender, no lesions              Pap taken: No. Bimanual Exam:  Uterus:  normal size, contour, position, consistency, mobility, non-tender              Adnexa: normal adnexa and no mass, fullness, tenderness  Rectovaginal: Confirms               Anus:  normal appearance  A:  Well Woman with normal exam  Contraception Depo Provera desired, due today  P:   Reviewed health and wellness pertinent to exam  Rx Depo Provera 150 mg IM every 3 months x 4  Pap smear not taken today at age 19   counseled on breast self exam, STD prevention, HIV risk factors and prevention, adequate intake of calcium and vitamin D, diet and exercise  return annually or prn  An After Visit Summary was printed and given to the patient.

## 2014-11-13 ENCOUNTER — Ambulatory Visit: Payer: Self-pay | Admitting: Nurse Practitioner

## 2014-11-17 ENCOUNTER — Ambulatory Visit: Payer: Self-pay | Admitting: Certified Nurse Midwife

## 2015-01-27 ENCOUNTER — Ambulatory Visit (INDEPENDENT_AMBULATORY_CARE_PROVIDER_SITE_OTHER): Payer: BLUE CROSS/BLUE SHIELD | Admitting: *Deleted

## 2015-01-27 VITALS — BP 106/64 | HR 80 | Wt 154.0 lb

## 2015-01-27 DIAGNOSIS — Z3040 Encounter for surveillance of contraceptives, unspecified: Secondary | ICD-10-CM | POA: Diagnosis not present

## 2015-01-27 MED ORDER — MEDROXYPROGESTERONE ACETATE 150 MG/ML IM SUSP
150.0000 mg | Freq: Once | INTRAMUSCULAR | Status: AC
  Administered 2015-01-27: 150 mg via INTRAMUSCULAR

## 2015-01-27 NOTE — Progress Notes (Signed)
Patient is within Depo Provera Calender Limits 6/1-6/15/16 Next Depo Due between: 8/17-/04/28/15 Last AEX: 11/11/14 with DL AEX Scheduled: 1/91/473/20/17 with DL  Patient is aware.  Pt tolerated Injection well in Left Ventrogluteal Muscle  Routed to provider for review, encounter closed.

## 2015-04-20 ENCOUNTER — Ambulatory Visit (INDEPENDENT_AMBULATORY_CARE_PROVIDER_SITE_OTHER): Payer: BLUE CROSS/BLUE SHIELD

## 2015-04-20 VITALS — BP 110/70 | HR 70 | Resp 14 | Wt 156.4 lb

## 2015-04-20 DIAGNOSIS — Z3040 Encounter for surveillance of contraceptives, unspecified: Secondary | ICD-10-CM

## 2015-04-20 MED ORDER — MEDROXYPROGESTERONE ACETATE 150 MG/ML IM SUSP
150.0000 mg | Freq: Once | INTRAMUSCULAR | Status: AC
  Administered 2015-04-20: 150 mg via INTRAMUSCULAR

## 2015-04-20 NOTE — Progress Notes (Signed)
Patient is within Depo Provera Calender Limits 8/17-8/31/16 Next Depo Due between: 11/8-11/22/16 Last AEX: 11/15/14 with DL AEX Scheduled: 1/61/09 with DL  Patient is aware when next depo is due.  Pt tolerated Injection well.  Routed to provider for review, encounter closed.

## 2015-07-06 ENCOUNTER — Ambulatory Visit (INDEPENDENT_AMBULATORY_CARE_PROVIDER_SITE_OTHER): Payer: BLUE CROSS/BLUE SHIELD | Admitting: *Deleted

## 2015-07-06 VITALS — BP 118/70 | HR 80 | Resp 14 | Wt 159.0 lb

## 2015-07-06 DIAGNOSIS — Z3009 Encounter for other general counseling and advice on contraception: Secondary | ICD-10-CM | POA: Diagnosis not present

## 2015-07-06 MED ORDER — MEDROXYPROGESTERONE ACETATE 150 MG/ML IM SUSP
150.0000 mg | Freq: Once | INTRAMUSCULAR | Status: AC
  Administered 2015-07-06: 150 mg via INTRAMUSCULAR

## 2015-07-06 NOTE — Progress Notes (Signed)
Patient ID: Laura Andrews, femaleElenore Andrews   DOB: 01-Mar-1996, 19 y.o.   MRN: [id number] Patient is within Depo Provera Calender Limits 11/8- 11/22 Next Depo Due between: 1/24-2/7  Last AEX: 11/15/14 with DL AEX Scheduled: 3/32/953/20/17 with DL  Patient is aware when next depo is due.  Pt tolerated Injection well.

## 2015-07-12 ENCOUNTER — Telehealth: Payer: Self-pay | Admitting: Nurse Practitioner

## 2015-07-12 NOTE — Telephone Encounter (Signed)
Left message on voicemail to call and reschedule cancelled appointment. °

## 2015-09-07 ENCOUNTER — Encounter (HOSPITAL_COMMUNITY): Payer: Self-pay | Admitting: Emergency Medicine

## 2015-09-07 ENCOUNTER — Emergency Department (HOSPITAL_COMMUNITY): Payer: BLUE CROSS/BLUE SHIELD

## 2015-09-07 ENCOUNTER — Emergency Department (HOSPITAL_COMMUNITY)
Admission: EM | Admit: 2015-09-07 | Discharge: 2015-09-07 | Disposition: A | Discharge status: Home / Self Care | Payer: BLUE CROSS/BLUE SHIELD | Attending: Emergency Medicine | Admitting: Emergency Medicine

## 2015-09-07 DIAGNOSIS — S83004A Unspecified dislocation of right patella, initial encounter: Secondary | ICD-10-CM | POA: Diagnosis not present

## 2015-09-07 DIAGNOSIS — Z91040 Latex allergy status: Secondary | ICD-10-CM | POA: Insufficient documentation

## 2015-09-07 DIAGNOSIS — S8391XA Sprain of unspecified site of right knee, initial encounter: Secondary | ICD-10-CM | POA: Diagnosis not present

## 2015-09-07 DIAGNOSIS — R52 Pain, unspecified: Secondary | ICD-10-CM

## 2015-09-07 DIAGNOSIS — Y998 Other external cause status: Secondary | ICD-10-CM | POA: Insufficient documentation

## 2015-09-07 DIAGNOSIS — Y9289 Other specified places as the place of occurrence of the external cause: Secondary | ICD-10-CM | POA: Insufficient documentation

## 2015-09-07 DIAGNOSIS — Y9389 Activity, other specified: Secondary | ICD-10-CM | POA: Diagnosis not present

## 2015-09-07 DIAGNOSIS — S8991XA Unspecified injury of right lower leg, initial encounter: Secondary | ICD-10-CM | POA: Diagnosis present

## 2015-09-07 DIAGNOSIS — M25561 Pain in right knee: Secondary | ICD-10-CM

## 2015-09-07 DIAGNOSIS — Z8742 Personal history of other diseases of the female genital tract: Secondary | ICD-10-CM | POA: Insufficient documentation

## 2015-09-07 MED ORDER — HYDROCODONE-ACETAMINOPHEN 5-325 MG PO TABS: 1.0000 | ORAL_TABLET | Freq: Four times a day (QID) | ORAL | Status: DC | PRN

## 2015-09-07 MED ORDER — HYDROMORPHONE HCL 1 MG/ML IJ SOLN
0.5000 mg | Freq: Once | INTRAMUSCULAR | Status: AC
  Administered 2015-09-07: 0.5 mg via INTRAVENOUS
  Filled 2015-09-07: qty 1

## 2015-09-07 MED ORDER — MORPHINE SULFATE (PF) 4 MG/ML IV SOLN
4.0000 mg | Freq: Once | INTRAVENOUS | Status: AC
  Administered 2015-09-07: 4 mg via INTRAVENOUS
  Filled 2015-09-07: qty 1

## 2015-09-07 MED ORDER — MORPHINE SULFATE (PF) 4 MG/ML IV SOLN
4.0000 mg | Freq: Once | INTRAVENOUS | Status: AC
Start: 2015-09-07 — End: 2015-09-07
  Administered 2015-09-07: 4 mg via INTRAVENOUS
  Filled 2015-09-07: qty 1

## 2015-09-07 MED ORDER — PROPOFOL 10 MG/ML IV BOLUS
0.5000 mg/kg | INTRAVENOUS | Status: DC | PRN
  Filled 2015-09-07: qty 20

## 2015-09-07 MED ORDER — PROPOFOL 10 MG/ML IV BOLUS
INTRAVENOUS | Status: AC | PRN
  Administered 2015-09-07 (×4): 36.3 mg via INTRAVENOUS

## 2015-09-07 MED ORDER — NAPROXEN 500 MG PO TABS: 500.0000 mg | ORAL_TABLET | Freq: Two times a day (BID) | ORAL | Status: AC | PRN

## 2015-09-07 NOTE — ED Notes (Signed)
Off floor for testing 

## 2015-09-07 NOTE — ED Provider Notes (Addendum)
Patient presents to emergency room for evaluation of right knee pain. Has history of prior patellar dislocation in her left knee. She's had to have corrective surgery. Patient was getting out of car when she twisted her right knee and acutely felt her patella dislocate.  She denies any medical problems. No history of complications with anesthesia in the past. Last oral intake was at 3 PM Physical Exam  BP 117/69 mmHg  Pulse 92  Temp(Src) 98.6 F (37 C) (Oral)  Resp 20  Ht 5\' 5"  (1.651 m)  Wt 72.576 kg  BMI 26.63 kg/m2  SpO2 100%  Physical Exam  Constitutional: She appears well-developed and well-nourished. No distress.  HENT:  Head: Normocephalic and atraumatic.  Right Ear: External ear normal.  Left Ear: External ear normal.  Mouth/Throat: No oropharyngeal exudate.  Eyes: Conjunctivae are normal. Right eye exhibits no discharge. Left eye exhibits no discharge. No scleral icterus.  Neck: Neck supple. No tracheal deviation present.  Cardiovascular: Normal rate, regular rhythm and normal heart sounds.   Pulmonary/Chest: Effort normal. No stridor. No respiratory distress.  Musculoskeletal: She exhibits no edema.  Grossly dislocated right patella  Neurological: She is alert. Cranial nerve deficit: no gross deficits.  Skin: Skin is warm and dry. No rash noted.  Psychiatric: She has a normal mood and affect.  Nursing note and vitals reviewed.   ED Course  .Sedation Date/Time: 09/07/2015 9:17 PM Performed by: Linwood DibblesKNAPP, Usha Slager Authorized by: Linwood DibblesKNAPP, Linnette Panella  Consent:    Consent obtained:  Written   Consent given by:  Parent   Risks discussed:  Allergic reaction, dysrhythmia, inadequate sedation, nausea, prolonged hypoxia resulting in organ damage, prolonged sedation necessitating reversal, respiratory compromise necessitating ventilatory assistance and intubation and vomiting   Alternatives discussed:  Analgesia without sedation Indications:    Sedation purpose:  Dislocation reduction  Procedure necessitating sedation performed by:  Physician performing sedation   Intended level of sedation:  Deep Pre-sedation assessment:    Time since last food or drink:  3pm   ASA classification: class 1 - normal, healthy patient     Neck mobility: normal     Mouth opening:  3 or more finger widths   Thyromental distance:  3 finger widths   Mallampati score:  II - soft palate, uvula, fauces visible   Pre-sedation assessments completed and reviewed: airway patency, cardiovascular function, hydration status, mental status, nausea/vomiting and pain level     Pre-sedation assessment completed:  09/07/2015 8:15 PM Immediate pre-procedure details:    Reassessment: Patient reassessed immediately prior to procedure     Reviewed: vital signs, relevant labs/tests and NPO status     Verified: bag valve mask available, emergency equipment available, intubation equipment available, IV patency confirmed, oxygen available and suction available   Procedure details (see MAR for exact dosages):    Sedation start time:  09/07/2015 8:20 PM   Sedation:  Propofol   Intra-procedure monitoring:  Blood pressure monitoring, cardiac monitor, continuous capnometry, continuous pulse oximetry, frequent LOC assessments and frequent vital sign checks   Intra-procedure events: none     Intra-procedure management:  Supplemental oxygen   Sedation end time:  09/07/2015 8:40 PM Post-procedure details:    Post-sedation assessment completed:  09/07/2015 9:20 PM   Post-sedation assessments completed and reviewed: airway patency, cardiovascular function, hydration status, mental status, nausea/vomiting, pain level and respiratory function     Patient is stable for discharge or admission: Yes     Patient tolerance:  Tolerated well, no immediate complications ORTHOPEDIC INJURY  TREATMENT Date/Time: 09/07/2015 9:20 PM Performed by: Linwood Dibbles Authorized by: Linwood Dibbles Consent: Written consent obtained. Injury location: R patella  dislocation. Pre-procedure neurovascular assessment: neurovascularly intact Pre-procedure distal perfusion: normal Pre-procedure neurological function: normal Pre-procedure range of motion: reduced Patient sedated: yes Immobilization: splint Post-procedure neurovascular assessment: post-procedure neurovascularly intact Comments: Pressure applied to the patella, after several attempts the patella was reduced    MDM Patella was successfully reduced.  Pt tolerated the procedure well.  Knee imobilizer crutches and outpatient follow up.      Linwood Dibbles, MD 09/07/15 2123  Medical screening examination/treatment/procedure(s) were conducted as a shared visit with non-physician practitioner(s) and myself.  I personally evaluated the patient during the encounter.  Added shared visit statement.  Linwood Dibbles, MD 11/05/15 1128

## 2015-09-07 NOTE — ED Notes (Addendum)
Per EMS-attempted to get of car when she twisted her knee-has occurred 4 on the left knee-this is the first time for it to happen on right-150 mg of Fentanyl given in route

## 2015-09-07 NOTE — ED Notes (Signed)
Bed: RU04WA25 Expected date:  Expected time:  Means of arrival:  Comments: EMS - leg injury

## 2015-09-07 NOTE — Discharge Instructions (Signed)
Wear knee immobilizer for at least 2 weeks for stabilization of knee. Use crutches for all weight bearing activities. Ice and elevate knee throughout the day. Alternate between naprosyn and norco for pain relief. Do not drive or operate machinery with pain medication use. Call your orthopedist Dr. Sherlean FootLucey tomorrow and schedule an appointment later this week for ongoing management of your knee sprain and dislocation. Return to the ER for changes or worsening symptoms.   Knee Sprain A knee sprain is a tear in the strong bands of tissue that connect the bones (ligaments) of your knee. HOME CARE  Raise (elevate) your injured knee to lessen puffiness (swelling).  To ease pain and puffiness, put ice on the injured area.  Put ice in a plastic bag.  Place a towel between your skin and the bag.  Leave the ice on for 20 minutes, 2-3 times a day.  Only take medicine as told by your doctor.  Do not leave your knee unprotected until pain and stiffness go away (usually 4-6 weeks).  If you have a cast or splint, do not get it wet. If your doctor told you to not take it off, cover it with a plastic bag when you shower or bathe. Do not swim.  Your doctor may have you do exercises to prevent or limit permanent weakness and stiffness. GET HELP RIGHT AWAY IF:   Your cast or splint becomes damaged.  Your pain gets worse.  You have a lot of pain, puffiness, or numbness below the cast or splint. MAKE SURE YOU:   Understand these instructions.  Will watch your condition.  Will get help right away if you are not doing well or get worse.   This information is not intended to replace advice given to you by your health care provider. Make sure you discuss any questions you have with your health care provider.   Document Released: 08/02/2009 Document Revised: 08/19/2013 Document Reviewed: 04/22/2013 Elsevier Interactive Patient Education 2016 [employer]  Patellar Dislocation A kneecap (patella)  becomes dislocated when the kneecap slips out of its normal position. This can cause pain and puffiness (swelling).  HOME CARE  Only take medicine as told by your doctor.  Wear your knee brace as told by your doctor. This supports your knee and stops you from bending your knee.  Use crutches as told by your doctor.  Put ice on the injured area.  Put ice in a plastic bag.  Place a towel between your skin and the bag.  Leave the ice on for 20 minutes, 2-3 times a day.  Rest your knee.  Perform exercises as told by your doctor. This is important.  Follow up with your doctor as told. GET HELP RIGHT AWAY IF:   You have more pain or puffiness of the knee.  Your medicine does not help your pain.  You have more warmth or redness (inflammation) of the knee.  You have stiffness or your knee gets stuck (locks) in one position. MAKE SURE YOU:   Understand these instructions.  Will watch your condition.  Will get help right away if you are not doing well or get worse.   This information is not intended to replace advice given to you by your health care provider. Make sure you discuss any questions you have with your health care provider.   Document Released: 02/01/2010 Document Revised: 06/04/2013 Document Reviewed: 03/26/2013 Elsevier Interactive Patient Education 2016 Else[employer] Inc.  Cryotherapy Cryotherapy is when you put ice on your  injury. Ice helps lessen pain and puffiness (swelling) after an injury. Ice works the best when you start using it in the first 24 to 48 hours after an injury. HOME CARE  Put a dry or damp towel between the ice pack and your skin.  You may press gently on the ice pack.  Leave the ice on for no more than 10 to 20 minutes at a time.  Check your skin after 5 minutes to make sure your skin is okay.  Rest at least 20 minutes between ice pack uses.  Stop using ice when your skin loses feeling (numbness).  Do not use ice on someone who cannot  tell you when it hurts. This includes small children and people with memory problems (dementia). GET HELP RIGHT AWAY IF:  You have white spots on your skin.  Your skin turns blue or pale.  Your skin feels waxy or hard.  Your puffiness gets worse. MAKE SURE YOU:   Understand these instructions.  Will watch your condition.  Will get help right away if you are not doing well or get worse.   This information is not intended to replace advice given to you by your health care provider. Make sure you discuss any questions you have with your health care provider.   Document Released: 01/31/2008 Document Revised: 11/06/2011 Document Reviewed: 04/06/2011 Elsevier Interactive Patient Education [employer]

## 2015-09-07 NOTE — ED Provider Notes (Signed)
CSN: [id number]     Arrival date & time 09/07/15  1810 History   First MD Initiated Contact with Patient 09/07/15 1820     Chief Complaint  Patient presents with  . Leg Injury     (Consider location/radiation/quality/duration/timing/severity/associated sxs/prior Treatment) HPI Comments: Terre Hanneman is a 20 y.o. female with a PMHx of multiple L knee dislocations s/p L knee patellar realignment and lateral release surgery by Dr. Sherlean Foot, who presents to the ED with complaints of sudden onset right knee pain that occurred after she was twisting to get into a car and twisted her right knee approximately 1 hour prior to arrival. Patient states the pain currently is 6/10 constant stabbing right knee pain radiating down to the ankle, worse with movement, and mildly improved with fentanyl 150 g given en route. Patient states she has had dislocations to her left knee in the past, but this is the first time that it is happened to her right knee. Associated symptoms include swelling, obvious deformity to the right knee, and tingling in her right foot. She denies falling, having any head injury or loss of consciousness. She denies any numbness, focal weakness aside from the loss of range of motion in her knee, chest pain, shortness breath, abdominal pain, nausea, vomiting, bruising, abrasions, back or neck pain, or any other injuries. She is not anything since 3 PM. She has needed prior dislocation reductions in the ER in [place].  Patient is a 20 y.o. female presenting with knee pain. The history is provided by the patient. No language interpreter was used.  Knee Pain Location:  Knee Time since incident:  1 hour Injury: yes   Mechanism of injury comment:  Twisted Knee location:  R knee Pain details:    Quality: stabbing.   Radiates to:  R leg   Severity:  Moderate   Onset quality:  Sudden   Duration:  1 hour   Timing:  Constant   Progression:  Unchanged Chronicity:  New Dislocation: yes   Prior  injury to area:  No Relieved by: fentanyl given en route. Worsened by:  Activity and bearing weight Ineffective treatments:  None tried Associated symptoms: decreased ROM, swelling and tingling   Associated symptoms: no back pain, no muscle weakness, no neck pain and no numbness     Past Medical History  Diagnosis Date  . Irregular menses since age 79  . Allergy    Past Surgical History  Procedure Laterality Date  . Mole removal      left ear and right leg, performed by plastic surgeon under anesthesia  . Knee surgery      patella realignment and lateral release  . Cosmetic surgery    . Wisdom tooth extraction  February 2016   No family history on file. Social History  Substance Use Topics  . Smoking status: Never Smoker   . Smokeless tobacco: Never Used  . Alcohol Use: 0.6 - 1.2 oz/week    1-2 Standard drinks or equivalent per week   OB History    Gravida Para Term Preterm AB TAB SAB Ectopic Multiple Living   0 0             Review of Systems  HENT: Negative for facial swelling (no head injury).   Respiratory: Negative for shortness of breath.   Cardiovascular: Negative for chest pain.  Gastrointestinal: Negative for nausea, vomiting and abdominal pain.  Genitourinary: Negative for difficulty urinating.  Musculoskeletal: Positive for joint swelling and arthralgias. Negative  for myalgias, back pain and neck pain.  Skin: Negative for color change and wound.  Allergic/Immunologic: Negative for immunocompromised state.  Neurological: Negative for syncope, weakness and numbness.  Psychiatric/Behavioral: Negative for confusion.   10 Systems reviewed and are negative for acute change except as noted in the HPI.    Allergies  Omnicef and Latex  Home Medications   Prior to Admission medications   Medication Sig Start Date End Date Taking? Authorizing Provider  medroxyPROGESTERone (DEPO-PROVERA) 150 MG/ML injection Inject 1 mL (150 mg total) into the muscle every 3  (three) months. 09/02/13   Ria CommentPatricia Grubb, FNP   BP 127/84 mmHg  Pulse 76  Temp(Src) 98.6 F (37 C) (Oral)  Resp 18  SpO2 100% Physical Exam  Constitutional: She is oriented to person, place, and time. Vital signs are normal. She appears well-developed and well-nourished.  Non-toxic appearance. No distress.  Afebrile, nontoxic, NAD  HENT:  Head: Normocephalic and atraumatic.  Mouth/Throat: Oropharynx is clear and moist and mucous membranes are normal.  Eyes: Conjunctivae and EOM are normal. Right eye exhibits no discharge. Left eye exhibits no discharge.  Neck: Normal range of motion. Neck supple.  Cardiovascular: Normal rate, regular rhythm, normal heart sounds and intact distal pulses.  Exam reveals no gallop and no friction rub.   No murmur heard. Pulmonary/Chest: Effort normal and breath sounds normal. No respiratory distress. She has no decreased breath sounds. She has no wheezes. She has no rhonchi. She has no rales.  Abdominal: Soft. Normal appearance and bowel sounds are normal. She exhibits no distension. There is no tenderness. There is no rigidity, no rebound and no guarding.  Musculoskeletal:       Right hip: Normal.       Right knee: She exhibits decreased range of motion, swelling, deformity, abnormal alignment and abnormal patellar mobility. Tenderness found. Medial joint line, lateral joint line and patellar tendon tenderness noted.       Right ankle: She exhibits decreased range of motion (due to pain). She exhibits no swelling, no deformity and normal pulse. Tenderness. Lateral malleolus and medial malleolus tenderness found.       Right lower leg: She exhibits tenderness. She exhibits no swelling and no deformity.       Legs: R leg immobilized in EMS device, and with stockings on which somewhat limits ability to visualize and assess fully. R knee with limited ROM due pain and immobilizer, with patella laterally displaced with obvious deformity noted, no obvious swelling,  with diffuse TTP along joint line and extending into tib/fib and ankle in medial/lateral malleoli. No calf or ankle swelling or deformity. Unable to assess if patellar tendon/ligaments attached. Unable to assess varus/valgus laxity. Unable to perform anterior drawer test. No palpable crepitus to knee/calf/ankle. No forefoot tenderness or deformity, no swelling. Strength limited due to pain but pt able to wiggle her toes. Sensation grossly intact, and distal pulses intact.  Skin intact. No R hip or thigh tenderness.  Neurological: She is alert and oriented to person, place, and time. She has normal strength. No sensory deficit.  Skin: Skin is warm, dry and intact. No rash noted.  Psychiatric: She has a normal mood and affect.  Nursing note and vitals reviewed.   ED Course  Reduction of dislocation Date/Time: 09/07/2015 8:37 PM Performed by: Marjean DonnaAMPRUBI-SOMS, Araceli Arango Authorized by: Allen DerryAMPRUBI-SOMS, Teancum Brule Consent: Verbal consent obtained. Written consent obtained. Risks and benefits: risks, benefits and alternatives were discussed Consent given by: patient Patient understanding: patient states understanding of the  procedure being performed Patient consent: the patient's understanding of the procedure matches consent given Procedure consent: procedure consent matches procedure scheduled Relevant documents: relevant documents present and verified Imaging studies: imaging studies available Patient identity confirmed: verbally with patient Time out: Immediately prior to procedure a "time out" was called to verify the correct patient, procedure, equipment, support staff and site/side marked as required. Local anesthesia used: no Patient sedated: yes Sedation type: moderate (conscious) sedation Sedatives: propofol Analgesia: hydromorphone Sedation start date/time: 09/07/2015 8:25 PM Sedation end date/time: 09/07/2015 8:38 PM Vitals: Vital signs were monitored during sedation. Patient tolerance:  Patient tolerated the procedure well with no immediate complications   (including critical care time) Labs Review Labs Reviewed - No data to display  Imaging Review Dg Knee 1-2 Views Right  09/07/2015  CLINICAL DATA:  Postreduction. EXAM: RIGHT KNEE - 1-2 VIEW COMPARISON:  Pre reduction exam earlier this day. FINDINGS: The lateral dislocation/subluxation on prior exam has been reduced. Question mild residual subluxation on the lateral view. No acute fracture. Tibial femoral alignment is maintained. No joint effusion. IMPRESSION: Improved alignment postreduction patellar dislocation with question of minimal residual lateral subluxation. No acute fracture. Electronically Signed   By: Rubye Oaks M.D.   On: 09/07/2015 21:09   Dg Tibia/fibula Right  09/07/2015  CLINICAL DATA:  Twisting injury to right knee and lower leg with pain. Initial encounter. EXAM: RIGHT TIBIA AND FIBULA - 2 VIEW COMPARISON:  None. FINDINGS: There is no evidence of fracture or other focal bone lesions. Soft tissues are unremarkable. IMPRESSION: Negative. Electronically Signed   By: Irish Lack M.D.   On: 09/07/2015 21:07   Dg Ankle Complete Right  09/07/2015  CLINICAL DATA:  Right leg twisting injury. Patellar deformity and tenderness to the lower leg and ankle. EXAM: RIGHT ANKLE - COMPLETE 3+ VIEW COMPARISON:  None. FINDINGS: No fracture or dislocation. The alignment and joint spaces are maintained. The ankle mortise is preserved. No focal soft tissue abnormality. IMPRESSION: Negative radiographs of the right ankle. Electronically Signed   By: Rubye Oaks M.D.   On: 09/07/2015 21:07   Dg Knee Complete 4 Views Right  09/07/2015  CLINICAL DATA:  Twisting injury of right knee with pain. Initial encounter. EXAM: RIGHT KNEE - COMPLETE 4+ VIEW COMPARISON:  None. FINDINGS: No acute fracture or dislocation identified. There may be a component of lateral patellar subluxation. Correlation suggested with location of the  patella on clinical examination. IMPRESSION: No acute fracture. Radiographic suggestion of lateral patellar subluxation without visible patellar fracture. Electronically Signed   By: Irish Lack M.D.   On: 09/07/2015 19:20   I have personally reviewed and evaluated these images and lab results as part of my medical decision-making.   EKG Interpretation None      MDM   Final diagnoses:  Patellar dislocation, right, initial encounter  Knee pain, acute, right  Knee sprain, right, initial encounter    20 y.o. female here with right knee injury sustained approximately 1 hour prior to arrival when she was leaning over to get into a car and subsequently had a patellar dislocation. She has had this several times on her left knee, this is the first time on the right. She denies any fall or any other injury. On exam, patella dislocated laterally, tenderness diffusely throughout the knee and lower leg and ankle, distal pulses intact and patient able to wiggle her toes. Sensation grossly intact.. Difficult to assess due to pain as well as patient having stockings. Will have  her stockings cut off, and obtain x-rays. We'll give pain medication. Will likely need to reduce under sedation. Will reassess shortly.  7:38 PM Nursing staff and radiology staff telling me that pt refused the xrays of tib/fib and ankle due to pain. Given 4mg  of morphine additionally and still having pain. Xray of knee returning showing subluxation of patella laterally without obvious fx. Will give additional dilaudid 0.5mg  now to see if we can get adequate pain control. Will attempt to reduce this after pain controlled, if able, and then proceed with tib/fib and ankle imaging. If unable, will likely need conscious sedation. Will reassess shortly.   8:04 PM Gave another 0.5mg  dilaudid and attempted to reduce subluxation. Pt not tolerating this and spasms preventing relocation. Will need to proceed with conscious sedation.  8:39  PM Reduction successful with conscious sedation. Will obtain portable knee image.  9:23 PM Xray after patella reduction with improved alignment. Knee immobilizer in place. Tib/fib and ankle xrays neg. Crutches given. Extremity NVI with soft compartments after splint placed. ROM of ankle improved now that patella has been reduced. Will send home with pain meds and have her f/up with Dr. Sherlean Foot this week (pt has already called him and scheduled an appt). Discussed nonweight bearing and RICE. I explained the diagnosis and have given explicit precautions to return to the ER including for any other new or worsening symptoms. The patient understands and accepts the medical plan as it's been dictated and I have answered their questions. Discharge instructions concerning home care and prescriptions have been given. The patient is STABLE and is discharged to home in good condition.  BP 117/69 mmHg  Pulse 92  Temp(Src) 98.6 F (37 C) (Oral)  Resp 20  Ht 5\' 5"  (1.651 m)  Wt 72.576 kg  BMI 26.63 kg/m2  SpO2 100%  Meds ordered this encounter  Medications  . morphine 4 MG/ML injection 4 mg    Sig:   . morphine 4 MG/ML injection 4 mg    Sig:   . HYDROmorphone (DILAUDID) injection 0.5 mg    Sig:   . HYDROmorphone (DILAUDID) injection 0.5 mg    Sig:   . propofol (DIPRIVAN) 10 mg/mL bolus/IV push 36.3 mg    Sig:   . propofol (DIPRIVAN) 10 mg/mL bolus/IV push    Sig:   . naproxen (NAPROSYN) 500 MG tablet    Sig: Take 1 tablet (500 mg total) by mouth 2 (two) times daily as needed for mild pain, moderate pain or headache (TAKE WITH MEALS.).    Dispense:  20 tablet    Refill:  0    Order Specific Question:  Supervising Provider    Answer:  MILLER, BRIAN [[id number]]  . HYDROcodone-acetaminophen (NORCO) 5-325 MG tablet    Sig: Take 1 tablet by mouth every 6 (six) hours as needed for severe pain.    Dispense:  24 tablet    Refill:  0    Order Specific Question:  Supervising Provider    Answer:  Eber Hong [[id number]]     Lisette Mancebo Camprubi-Soms, PA-C 09/07/15 2129

## 2015-09-10 ENCOUNTER — Other Ambulatory Visit: Payer: Self-pay | Admitting: Orthopedic Surgery

## 2015-09-10 DIAGNOSIS — M25561 Pain in right knee: Secondary | ICD-10-CM

## 2015-09-15 ENCOUNTER — Ambulatory Visit
Admission: RE | Admit: 2015-09-15 | Discharge: 2015-09-15 | Disposition: A | Discharge status: Home / Self Care | Payer: BLUE CROSS/BLUE SHIELD | Source: Ambulatory Visit | Attending: Orthopedic Surgery | Admitting: Orthopedic Surgery

## 2015-09-15 DIAGNOSIS — M25561 Pain in right knee: Secondary | ICD-10-CM

## 2015-09-21 ENCOUNTER — Ambulatory Visit (INDEPENDENT_AMBULATORY_CARE_PROVIDER_SITE_OTHER): Payer: BLUE CROSS/BLUE SHIELD

## 2015-09-21 VITALS — BP 118/72 | HR 76 | Resp 16 | Wt 164.4 lb

## 2015-09-21 DIAGNOSIS — Z3009 Encounter for other general counseling and advice on contraception: Secondary | ICD-10-CM | POA: Diagnosis not present

## 2015-09-21 MED ORDER — MEDROXYPROGESTERONE ACETATE 150 MG/ML IM SUSP
150.0000 mg | Freq: Once | INTRAMUSCULAR | Status: AC
  Administered 2015-09-21: 150 mg via INTRAMUSCULAR

## 2015-09-21 NOTE — Progress Notes (Signed)
Patient here for Depo provera injection. Patient is within Depo Provera Calender Limits Yes Last Injection on 07/06/2015 Next Depo Due between: 04/11-04/25  Last AEX: 11/15/14 with DL AEX Scheduled: 0/98/11 with DL  Patient is aware when next depo is due.  Pt tolerated Injection well on Left gluteus. Per pt request  Routed to provider, encounter closed.

## 2015-09-29 HISTORY — PX: PATELLAR TENDON REPAIR: SHX737

## 2015-11-15 ENCOUNTER — Ambulatory Visit: Payer: BLUE CROSS/BLUE SHIELD | Admitting: Nurse Practitioner

## 2015-11-17 ENCOUNTER — Encounter: Payer: Self-pay | Admitting: Nurse Practitioner

## 2015-11-17 ENCOUNTER — Ambulatory Visit (INDEPENDENT_AMBULATORY_CARE_PROVIDER_SITE_OTHER): Payer: BLUE CROSS/BLUE SHIELD | Admitting: Nurse Practitioner

## 2015-11-17 VITALS — BP 118/74 | HR 76 | Ht 65.25 in | Wt 164.0 lb

## 2015-11-17 DIAGNOSIS — Z01419 Encounter for gynecological examination (general) (routine) without abnormal findings: Secondary | ICD-10-CM | POA: Diagnosis not present

## 2015-11-17 DIAGNOSIS — Z0000 Encounter for general adult medical examination without abnormal findings: Secondary | ICD-10-CM | POA: Diagnosis not present

## 2015-11-17 DIAGNOSIS — Z113 Encounter for screening for infections with a predominantly sexual mode of transmission: Secondary | ICD-10-CM

## 2015-11-17 DIAGNOSIS — Z3040 Encounter for surveillance of contraceptives, unspecified: Secondary | ICD-10-CM | POA: Diagnosis not present

## 2015-11-17 NOTE — Patient Instructions (Signed)
General topics  Next pap or exam is  due in 1 year Take a Women's multivitamin Take 1200 mg. of calcium daily - prefer dietary If any concerns in interim to call back  Breast Self-Awareness Practicing breast self-awareness may pick up problems early, prevent significant medical complications, and possibly save your life. By practicing breast self-awareness, you can become familiar with how your breasts look and feel and if your breasts are changing. This allows you to notice changes early. It can also offer you some reassurance that your breast health is good. One way to learn what is normal for your breasts and whether your breasts are changing is to do a breast self-exam. If you find a lump or something that was not present in the past, it is best to contact your caregiver right away. Other findings that should be evaluated by your caregiver include nipple discharge, especially if it is bloody; skin changes or reddening; areas where the skin seems to be pulled in (retracted); or new lumps and bumps. Breast pain is seldom associated with cancer (malignancy), but should also be evaluated by a caregiver. BREAST SELF-EXAM The best time to examine your breasts is 5 7 days after your menstrual period is over.  ExitCare Patient Information 2013 [employer].   Exercise to Stay Healthy Exercise helps you become and stay healthy. EXERCISE IDEAS AND TIPS Choose exercises that:  You enjoy.  Fit into your day. You do not need to exercise really hard to be healthy. You can do exercises at a slow or medium level and stay healthy. You can:  Stretch before and after working out.  Try yoga, Pilates, or tai chi.  Lift weights.  Walk fast, swim, jog, run, climb stairs, bicycle, dance, or rollerskate.  Take aerobic classes. Exercises that burn about 150 calories:  Running 1  miles in 15 minutes.  Playing volleyball for 45 to 60 minutes.  Washing and waxing a car for 45 to 60  minutes.  Playing touch football for 45 minutes.  Walking 1  miles in 35 minutes.  Pushing a stroller 1  miles in 30 minutes.  Playing basketball for 30 minutes.  Raking leaves for 30 minutes.  Bicycling 5 miles in 30 minutes.  Walking 2 miles in 30 minutes.  Dancing for 30 minutes.  Shoveling snow for 15 minutes.  Swimming laps for 20 minutes.  Walking up stairs for 15 minutes.  Bicycling 4 miles in 15 minutes.  Gardening for 30 to 45 minutes.  Jumping rope for 15 minutes.  Washing windows or floors for 45 to 60 minutes. Document Released: 09/16/2010 Document Revised: 11/06/2011 Document Reviewed: 09/16/2010 ExitCare Patient Information 2013 ExitCare[employer].   Other topics ( that may be useful information):    Sexually Transmitted Disease Sexually transmitted disease (STD) refers to any infection that is passed from person to person during sexual activity. This may happen by way of saliva, semen, blood, vaginal mucus, or urine. Common STDs include:  Gonorrhea.  Chlamydia.  Syphilis.  HIV/AIDS.  Genital herpes.  Hepatitis B and C.  Trichomonas.  Human papillomavirus (HPV).  Pubic lice. CAUSES  An STD may be spread by bacteria, virus, or parasite. A person can get an STD by:  Sexual intercourse with an infected person.  Sharing sex toys with an infected person.  Sharing needles with an infected person.  Having intimate contact with the genitals, mouth, or rectal areas of an infected person. SYMPTOMS  Some people may not have any symptoms, but   they can still pass the infection to others. Different STDs have different symptoms. Symptoms include:  Painful or bloody urination.  Pain in the pelvis, abdomen, vagina, anus, throat, or eyes.  Skin rash, itching, irritation, growths, or sores (lesions). These usually occur in the genital or anal area.  Abnormal vaginal discharge.  Penile discharge in men.  Soft, flesh-colored skin growths in the  genital or anal area.  Fever.  Pain or bleeding during sexual intercourse.  Swollen glands in the groin area.  Yellow skin and eyes (jaundice). This is seen with hepatitis. DIAGNOSIS  To make a diagnosis, your caregiver may:  Take a medical history.  Perform a physical exam.  Take a specimen (culture) to be examined.  Examine a sample of discharge under a microscope.  Perform blood test TREATMENT   Chlamydia, gonorrhea, trichomonas, and syphilis can be cured with antibiotic medicine.  Genital herpes, hepatitis, and HIV can be treated, but not cured, with prescribed medicines. The medicines will lessen the symptoms.  Genital warts from HPV can be treated with medicine or by freezing, burning (electrocautery), or surgery. Warts may come back.  HPV is a virus and cannot be cured with medicine or surgery.However, abnormal areas may be followed very closely by your caregiver and may be removed from the cervix, vagina, or vulva through office procedures or surgery. If your diagnosis is confirmed, your recent sexual partners need treatment. This is true even if they are symptom-free or have a negative culture or evaluation. They should not have sex until their caregiver says it is okay. HOME CARE INSTRUCTIONS  All sexual partners should be informed, tested, and treated for all STDs.  Take your antibiotics as directed. Finish them even if you start to feel better.  Only take over-the-counter or prescription medicines for pain, discomfort, or fever as directed by your caregiver.  Rest.  Eat a balanced diet and drink enough fluids to keep your urine clear or pale yellow.  Do not have sex until treatment is completed and you have followed up with your caregiver. STDs should be checked after treatment.  Keep all follow-up appointments, Pap tests, and blood tests as directed by your caregiver.  Only use latex condoms and water-soluble lubricants during sexual activity. Do not use  petroleum jelly or oils.  Avoid alcohol and illegal drugs.  Get vaccinated for HPV and hepatitis. If you have not received these vaccines in the past, talk to your caregiver about whether one or both might be right for you.  Avoid risky sex practices that can break the skin. The only way to avoid getting an STD is to avoid all sexual activity.Latex condoms and dental dams (for oral sex) will help lessen the risk of getting an STD, but will not completely eliminate the risk. SEEK MEDICAL CARE IF:   You have a fever.  You have any new or worsening symptoms. Document Released: 11/04/2002 Document Revised: 11/06/2011 Document Reviewed: 11/11/2010 [facility] Patient Information 2013 [place].    Domestic Abuse You are being battered or abused if someone close to you hits, pushes, or physically hurts you in any way. You also are being abused if you are forced into activities. You are being sexually abused if you are forced to have sexual contact of any kind. You are being emotionally abused if you are made to feel worthless or if you are constantly threatened. It is important to remember that help is available. No one has the right to abuse you. PREVENTION OF FURTHER  ABUSE  Learn the warning signs of danger. This varies with situations but may include: the use of alcohol, threats, isolation from friends and family, or forced sexual contact. Leave if you feel that violence is going to occur.  If you are attacked or beaten, report it to the police so the abuse is documented. You do not have to press charges. The police can protect you while you or the attackers are leaving. Get the officer's name and badge number and a copy of the report.  Find someone you can trust and tell them what is happening to you: your caregiver, a nurse, clergy member, close friend or family member. Feeling ashamed is natural, but remember that you have done nothing wrong. No one deserves abuse. Document Released:  08/11/2000 Document Revised: 11/06/2011 Document Reviewed: 10/20/2010 ExitCare Patient Information 2013 [employer]Care[employer].    How Much is Too Much Alcohol? Drinking too much alcohol can cause injury, accidents, and health problems. These types of problems can include:   Car crashes.  Falls.  Family fighting (domestic violence).  Drowning.  Fights.  Injuries.  Burns.  Damage to certain organs.  Having a baby with birth defects. ONE DRINK CAN BE TOO MUCH WHEN YOU ARE:  Working.  Pregnant or breastfeeding.  Taking medicines. Ask your doctor.  Driving or planning to drive. If you or someone you know has a drinking problem, get help from a doctor.  Document Released: 06/10/2009 Document Revised: 11/06/2011 Document Reviewed: 06/10/2009 ExitCare Patient Information 2013 ExitCare[employer].   Smoking Hazards Smoking cigarettes is extremely bad for your health. Tobacco smoke has over 200 known poisons in it. There are over 60 chemicals in tobacco smoke that cause cancer. Some of the chemicals found in cigarette smoke include:   Cyanide.  Benzene.  Formaldehyde.  Methanol (wood alcohol).  Acetylene (fuel used in welding torches).  Ammonia. Cigarette smoke also contains the poisonous gases nitrogen oxide and carbon monoxide.  Cigarette smokers have an increased risk of many serious medical problems and Smoking causes approximately:  90% of all lung cancer deaths in men.  80% of all lung cancer deaths in women.  90% of deaths from chronic obstructive lung disease. Compared with nonsmokers, smoking increases the risk of:  Coronary heart disease by 2 to 4 times.  Stroke by 2 to 4 times.  Men developing lung cancer by 23 times.  Women developing lung cancer by 13 times.  Dying from chronic obstructive lung diseases by 12 times.  . Smoking is the most preventable cause of death and disease in our society.  WHY IS SMOKING ADDICTIVE?  Nicotine is the chemical  agent in tobacco that is capable of causing addiction or dependence.  When you smoke and inhale, nicotine is absorbed rapidly into the bloodstream through your lungs. Nicotine absorbed through the lungs is capable of creating a powerful addiction. Both inhaled and non-inhaled nicotine may be addictive.  Addiction studies of cigarettes and spit tobacco show that addiction to nicotine occurs mainly during the teen years, when young people begin using tobacco products. WHAT ARE THE BENEFITS OF QUITTING?  There are many health benefits to quitting smoking.   Likelihood of developing cancer and heart disease decreases. Health improvements are seen almost immediately.  Blood pressure, pulse rate, and breathing patterns start returning to normal soon after quitting. QUITTING SMOKING   American Lung Association - 1-800-LUNGUSA  American Cancer Society - 1-800-ACS-2345 Document Released: 09/21/2004 Document Revised: 11/06/2011 Document Reviewed: 05/26/2009 ExitCare Patient Information 2013 ExitCare,   LLC.   Stress Management Stress is a state of physical or mental tension that often results from changes in your life or normal routine. Some common causes of stress are:  Death of a loved one.  Injuries or severe illnesses.  Getting fired or changing jobs.  Moving into a new home. Other causes may be:  Sexual problems.  Business or financial losses.  Taking on a large debt.  Regular conflict with someone at home or at work.  Constant tiredness from lack of sleep. It is not just bad things that are stressful. It may be stressful to:  Win the lottery.  Get married.  Buy a new car. The amount of stress that can be easily tolerated varies from person to person. Changes generally cause stress, regardless of the types of change. Too much stress can affect your health. It may lead to physical or emotional problems. Too little stress (boredom) may also become stressful. SUGGESTIONS TO  REDUCE STRESS:  Talk things over with your family and friends. It often is helpful to share your concerns and worries. If you feel your problem is serious, you may want to get help from a professional counselor.  Consider your problems one at a time instead of lumping them all together. Trying to take care of everything at once may seem impossible. List all the things you need to do and then start with the most important one. Set a goal to accomplish 2 or 3 things each day. If you expect to do too many in a single day you will naturally fail, causing you to feel even more stressed.  Do not use alcohol or drugs to relieve stress. Although you may feel better for a short time, they do not remove the problems that caused the stress. They can also be habit forming.  Exercise regularly - at least 3 times per week. Physical exercise can help to relieve that "uptight" feeling and will relax you.  The shortest distance between despair and hope is often a good night's sleep.  Go to bed and get up on time allowing yourself time for appointments without being rushed.  Take a short "time-out" period from any stressful situation that occurs during the day. Close your eyes and take some deep breaths. Starting with the muscles in your face, tense them, hold it for a few seconds, then relax. Repeat this with the muscles in your neck, shoulders, hand, stomach, back and legs.  Take good care of yourself. Eat a balanced diet and get plenty of rest.  Schedule time for having fun. Take a break from your daily routine to relax. HOME CARE INSTRUCTIONS   Call if you feel overwhelmed by your problems and feel you can no longer manage them on your own.  Return immediately if you feel like hurting yourself or someone else. Document Released: 02/07/2001 Document Revised: 11/06/2011 Document Reviewed: 09/30/2007 ExitCare Patient Information 2013 ExitCare, LLC.  

## 2015-11-17 NOTE — Progress Notes (Signed)
Patient ID: Laura Andrews, female   DOB: May 22, 1996, 20 y.o.   MRN: [id number]  20 y.o. G0P0000 Single  Caucasian Fe here for annual exam.  Now graduated 08/2014 with business degree.  Now works at [employer].  Menses now absent on Depo Provera.  Had right knee surgery 5 weeks ago with allograft of right mensicus and has been immobile with limited activity.  She feels this is the cause of increased weight in 2 months.  PT at home and will be progressing to weights.  Same partner for over a year.  Likes Depo Provera and will continue.  Patient's last menstrual period was 06/29/2011 (approximate).          Sexually active: Yes.    The current method of family planning is Depo-Provera injections.    Exercising: Yes.    Exercise is limited by knee surgery, patella tendon repair.. Smoker:  no  Health Maintenance: Pap: Never TDaP:  2009 HIV: 2012, negative per patient Labs: HB: 15.6 Urine: negative   reports that she has never smoked. She has never used smokeless tobacco. She reports that she drinks about 0.6 - 1.2 oz of alcohol per week. She reports that she does not use illicit drugs.  Past Medical History  Diagnosis Date  . Irregular menses since age 63  . Allergy     Past Surgical History  Procedure Laterality Date  . Mole removal      left ear and right leg, performed by plastic surgeon under anesthesia  . Knee surgery Left 12/2010    patella realignment and lateral release  . Cosmetic surgery    . Wisdom tooth extraction  February 2016  . Patellar tendon repair Right 09/2015    Current Outpatient Prescriptions  Medication Sig Dispense Refill  . medroxyPROGESTERone (DEPO-PROVERA) 150 MG/ML injection Inject 1 mL (150 mg total) into the muscle every 3 (three) months. 1 mL 3  . naproxen (NAPROSYN) 500 MG tablet Take 1 tablet (500 mg total) by mouth 2 (two) times daily as needed for mild pain, moderate pain or headache (TAKE WITH MEALS.). 20 tablet 0   No current facility-administered  medications for this visit.    No family history on file.  ROS:  Pertinent items are noted in HPI.  Otherwise, a comprehensive ROS was negative.  Exam:   BP 118/74 mmHg  Pulse 76  Ht 5' 5.25" (1.657 m)  Wt 164 lb (74.39 kg)  BMI 27.09 kg/m2  LMP 06/29/2011 (Approximate) Height: 5' 5.25" (165.7 cm) Ht Readings from Last 3 Encounters:  11/17/15 5' 5.25" (1.657 m)  09/07/15  (1.651 m)  11/11/14  (1.651 m)    General appearance: alert, cooperative and appears stated age Head: Normocephalic, without obvious abnormality, atraumatic Neck: no adenopathy, supple, symmetrical, trachea midline and thyroid normal to inspection and palpation Lungs: clear to auscultation bilaterally Breasts: normal appearance, no masses or tenderness Heart: regular rate and rhythm Abdomen: soft, non-tender; no masses,  no organomegaly Extremities: extremities normal, atraumatic, no cyanosis or edema Skin: Skin color, texture, turgor normal. No rashes or lesions Lymph nodes: Cervical, supraclavicular, and axillary nodes normal. No abnormal inguinal nodes palpated Neurologic: Grossly normal   Pelvic: External genitalia:  no lesions              Urethra:  normal appearing urethra with no masses, tenderness or lesions              Bartholin's and Skene's: normal  Vagina: normal appearing vagina with normal color and discharge, no lesions              Cervix: anteverted              Pap taken: Yes.   Bimanual Exam:  Uterus:  normal size, contour, position, consistency, mobility, non-tender              Adnexa: no mass, fullness, tenderness               Rectovaginal: Confirms               Anus:  normal sphincter tone, no lesions  Chaperone present: yes  A:  Well Woman with normal exam  Contraception with Depo Provera  History of irregular menses prior to Depo   R/O STD's  S/P Allograft right meniscus 5 wk's ago   P:   Reviewed health and wellness pertinent to exam  Pap  smear as above  Will follow with labs  Counseled on breast self exam, STD prevention, HIV risk factors and prevention, adequate intake of calcium and vitamin D, diet and exercise return annually or prn  An After Visit Summary was printed and given to the patient.

## 2015-11-18 LAB — STD PANEL
HIV: NONREACTIVE
Hepatitis B Surface Ag: NEGATIVE

## 2015-11-18 LAB — IPS PAP TEST WITH REFLEX TO HPV

## 2015-11-18 NOTE — Progress Notes (Signed)
Encounter reviewed by Dr. Brook Amundson C. Silva.  

## 2015-11-19 LAB — HEMOGLOBIN, FINGERSTICK: Hemoglobin, fingerstick: 15.6 g/dL (ref 12.0–16.0)

## 2015-11-19 LAB — IPS N GONORRHOEA AND CHLAMYDIA BY PCR

## 2015-12-07 ENCOUNTER — Ambulatory Visit (INDEPENDENT_AMBULATORY_CARE_PROVIDER_SITE_OTHER): Payer: BLUE CROSS/BLUE SHIELD

## 2015-12-07 VITALS — BP 110/64 | HR 80 | Ht 65.25 in | Wt 164.8 lb

## 2015-12-07 DIAGNOSIS — Z3042 Encounter for surveillance of injectable contraceptive: Secondary | ICD-10-CM | POA: Diagnosis not present

## 2015-12-07 MED ORDER — MEDROXYPROGESTERONE ACETATE 150 MG/ML IM SUSP
150.0000 mg | Freq: Once | INTRAMUSCULAR | Status: AC
  Administered 2015-12-07: 150 mg via INTRAMUSCULAR

## 2015-12-07 NOTE — Progress Notes (Signed)
Patient here for Depo Provera 150mg  injection. She is within calendar dates. Given in RUOQ and she tolerated injection well. Next injection due 02-22-16 through 03-07-16. AEX 11-17-15.

## 2015-12-29 DIAGNOSIS — M25661 Stiffness of right knee, not elsewhere classified: Secondary | ICD-10-CM | POA: Diagnosis not present

## 2015-12-29 DIAGNOSIS — S838X1D Sprain of other specified parts of right knee, subsequent encounter: Secondary | ICD-10-CM | POA: Diagnosis not present

## 2015-12-29 DIAGNOSIS — R531 Weakness: Secondary | ICD-10-CM | POA: Diagnosis not present

## 2015-12-29 DIAGNOSIS — M25561 Pain in right knee: Secondary | ICD-10-CM | POA: Diagnosis not present

## 2016-01-04 ENCOUNTER — Telehealth: Payer: Self-pay | Admitting: Nurse Practitioner

## 2016-01-04 NOTE — Telephone Encounter (Signed)
Patient has some questions about starting a medication for "sweating" and would like Patty Grubb's, FNP opinion.

## 2016-01-05 NOTE — Telephone Encounter (Signed)
Left message to call Kaitlyn at [phone]. 

## 2016-01-07 NOTE — Telephone Encounter (Signed)
Some people will see endocrinologist for the oral med's.  There is treatment with the deodorant which she has already tried.  Also a few women are doing Botox injections which also helps.

## 2016-01-07 NOTE — Telephone Encounter (Signed)
Spoke with patient. Advised of message as seen below from Ria CommentPatricia Grubb, FNP. She is agreeable. Reports she would like to speak with her dermatologist to see what her options are. Will return call if she needs any further assistance.  Routing to provider for final review. Patient agreeable to disposition. Will close encounter.

## 2016-01-07 NOTE — Telephone Encounter (Signed)
Left message to call Kaitlyn at [phone]. 

## 2016-01-07 NOTE — Telephone Encounter (Signed)
Patient returned call

## 2016-01-07 NOTE — Telephone Encounter (Signed)
Spoke with patient. Patient states that she has not seen someone regarding starting a medication for "sweating" yet, but would like to consider this. Reports her dermatologist has prescribed her topical medications for this in the past that did not help. Reports sweating is mainly in the axillary region. Patient would like Ria CommentPatricia Grubb, FNP advice on if taking an oral medication would be recommended and who she should see for this prescription. Patient does not have a PCP.

## 2016-02-22 ENCOUNTER — Ambulatory Visit (INDEPENDENT_AMBULATORY_CARE_PROVIDER_SITE_OTHER): Payer: BLUE CROSS/BLUE SHIELD

## 2016-02-22 VITALS — BP 112/60 | HR 80 | Resp 16 | Wt 166.0 lb

## 2016-02-22 DIAGNOSIS — Z3042 Encounter for surveillance of injectable contraceptive: Secondary | ICD-10-CM | POA: Diagnosis not present

## 2016-02-22 MED ORDER — MEDROXYPROGESTERONE ACETATE 150 MG/ML IM SUSP
150.0000 mg | Freq: Once | INTRAMUSCULAR | Status: AC
  Administered 2016-02-22: 150 mg via INTRAMUSCULAR

## 2016-02-22 NOTE — Progress Notes (Signed)
Patient is here for Depo Provera Injection Patient is within Depo Provera Calender Limits 6/27 - 7/11 Next Depo Due between: 9/12 - 9/26 Last AEX: 11/17/15 DL AEX Scheduled: 1/4/784/9/18  Patient is aware when next depo is due  Pt tolerated Injection well.  Routed to provider for review, encounter closed.

## 2016-04-18 DIAGNOSIS — M228X1 Other disorders of patella, right knee: Secondary | ICD-10-CM | POA: Diagnosis not present

## 2016-04-18 DIAGNOSIS — Z9889 Other specified postprocedural states: Secondary | ICD-10-CM | POA: Diagnosis not present

## 2016-05-09 ENCOUNTER — Ambulatory Visit: Payer: BLUE CROSS/BLUE SHIELD

## 2016-05-11 ENCOUNTER — Ambulatory Visit (INDEPENDENT_AMBULATORY_CARE_PROVIDER_SITE_OTHER): Payer: BLUE CROSS/BLUE SHIELD

## 2016-05-11 VITALS — BP 112/60 | HR 88 | Resp 16 | Wt 168.0 lb

## 2016-05-11 DIAGNOSIS — Z3042 Encounter for surveillance of injectable contraceptive: Secondary | ICD-10-CM | POA: Diagnosis not present

## 2016-05-11 MED ORDER — MEDROXYPROGESTERONE ACETATE 150 MG/ML IM SUSP
150.0000 mg | Freq: Once | INTRAMUSCULAR | Status: AC
  Administered 2016-05-11: 150 mg via INTRAMUSCULAR

## 2016-05-11 NOTE — Progress Notes (Signed)
Patient is here for Depo Provera Injection Patient is within Depo Provera Calender Limits 9/12-9/26 Next Depo Due between: 11/30-12/14 Last AEX: 11/17/15 AEX Scheduled: 12/04/16  Patient is aware when next depo is due  Pt tolerated Injection well.  Routed to provider for review, encounter closed.

## 2016-06-13 DIAGNOSIS — J029 Acute pharyngitis, unspecified: Secondary | ICD-10-CM | POA: Diagnosis not present

## 2016-06-15 ENCOUNTER — Ambulatory Visit (INDEPENDENT_AMBULATORY_CARE_PROVIDER_SITE_OTHER): Payer: BLUE CROSS/BLUE SHIELD

## 2016-06-15 ENCOUNTER — Ambulatory Visit (INDEPENDENT_AMBULATORY_CARE_PROVIDER_SITE_OTHER): Payer: BLUE CROSS/BLUE SHIELD | Admitting: Physician Assistant

## 2016-06-15 VITALS — BP 120/70 | HR 90 | Temp 98.1°F | Resp 17 | Ht 65.5 in | Wt 169.0 lb

## 2016-06-15 DIAGNOSIS — J029 Acute pharyngitis, unspecified: Secondary | ICD-10-CM | POA: Diagnosis not present

## 2016-06-15 DIAGNOSIS — M791 Myalgia, unspecified site: Secondary | ICD-10-CM

## 2016-06-15 DIAGNOSIS — R059 Cough, unspecified: Secondary | ICD-10-CM

## 2016-06-15 DIAGNOSIS — R05 Cough: Secondary | ICD-10-CM

## 2016-06-15 DIAGNOSIS — J069 Acute upper respiratory infection, unspecified: Secondary | ICD-10-CM | POA: Diagnosis not present

## 2016-06-15 LAB — POCT CBC
Granulocyte percent: 59.3 %G (ref 37–80)
HEMATOCRIT: 45.2 % (ref 37.7–47.9)
Hemoglobin: 16.6 g/dL — AB (ref 12.2–16.2)
LYMPH, POC: 1.9 (ref 0.6–3.4)
MCH, POC: 31.1 pg (ref 27–31.2)
MCHC: 36.8 g/dL — AB (ref 31.8–35.4)
MCV: 84.6 fL (ref 80–97)
MID (CBC): 0.6 (ref 0–0.9)
MPV: 6.5 fL (ref 0–99.8)
POC GRANULOCYTE: 3.6 (ref 2–6.9)
POC LYMPH %: 30.6 % (ref 10–50)
POC MID %: 10.1 % (ref 0–12)
Platelet Count, POC: 232 10*3/uL (ref 142–424)
RBC: 5.35 M/uL (ref 4.04–5.48)
RDW, POC: 12 %
WBC: 6.1 10*3/uL (ref 4.6–10.2)

## 2016-06-15 LAB — POCT INFLUENZA A/B
INFLUENZA A, POC: NEGATIVE
INFLUENZA B, POC: NEGATIVE

## 2016-06-15 MED ORDER — MAGIC MOUTHWASH W/LIDOCAINE: 10.0000 mL | Freq: Four times a day (QID) | ORAL | 0 refills | Status: DC | PRN

## 2016-06-15 MED ORDER — HYDROCOD POLST-CPM POLST ER 10-8 MG/5ML PO SUER: 5.0000 mL | Freq: Two times a day (BID) | ORAL | 0 refills | Status: DC | PRN

## 2016-06-15 MED ORDER — BENZONATATE 100 MG PO CAPS: 100.0000 mg | ORAL_CAPSULE | Freq: Three times a day (TID) | ORAL | 0 refills | Status: DC | PRN

## 2016-06-15 NOTE — Progress Notes (Signed)
MRN: [id number] DOB: May 15, 1996  Subjective:   Laura Andrews is a 20 y.o. female presenting for chief complaint of Nasal Congestion (Onset 5 days); Cough; and Sore Throat ([facility]  [facility]) .  Reports 5 day history of sinus congestion, rhinorrhea, ear fullness, sore throat, dry cough (two episodes with red tint to it) and myalgia, chills and malaise. [facility], was tested for strep and both the rapid and culture were negative.Has tried dayquil and nyquil, with minimal relief. Pt was also given a prescription for lidocaine solution for her sore throat but notes that it was too thick so she did not use it. Denies recent travel, fever, red eyes, difficulty swallowing, wheezing, shortness of breath, chest tightness and chest pain, night sweats, chills, decreased appetite, weight loss, nausea, vomiting, abdominal pain and diarrhea. Has not had recent sick contact with anyone.  No history of seasonal allergies or history of asthma. She does have a history of bronchitis.Patient has not had flu shot this season. Denies smoking. Occasional alcohol use. Denies any other aggravating or relieving factors, no other questions or concerns.  Laura Andrews has a current medication list which includes the following prescription(s): medroxyprogesterone, naproxen, and lidocaine. Also is allergic to omnicef [cefdinir] and latex.  Laura Andrews  has a past medical history of Allergy and Irregular menses (since age 55). Also  has a past surgical history that includes Mole removal; Knee surgery (Left, 12/2010); Cosmetic surgery; Wisdom tooth extraction (February 2016); and Patellar tendon repair (Right, 09/2015).  Objective:   Vitals: BP 120/70 (BP Location: Right Arm, Patient Position: Sitting, Cuff Size: Normal)   Pulse 90   Temp 98.1 F (36.7 C) (Oral)   Resp 17   Ht 5' 5.5" (1.664 m)   Wt 169 lb (76.7 kg)   SpO2 98%   BMI 27.70 kg/m   Physical Exam  Constitutional: She is  oriented to person, place, and time. She appears well-developed and well-nourished.  HENT:  Head: Normocephalic and atraumatic.  Right Ear: External ear and ear canal normal. Tympanic membrane is injected.  Left Ear: External ear and ear canal normal. Tympanic membrane is injected.  Eyes: Conjunctivae are normal.  Neck: Normal range of motion.  Cardiovascular: Normal rate, regular rhythm and normal heart sounds.   Pulmonary/Chest: Effort normal and breath sounds normal.  Lymphadenopathy:       Head (right side): Submandibular adenopathy present. No submental, no tonsillar, no preauricular, no posterior auricular and no occipital adenopathy present.       Head (left side): No submental, no submandibular, no tonsillar, no preauricular, no posterior auricular and no occipital adenopathy present.    She has no cervical adenopathy.       Right: No supraclavicular adenopathy present.       Left: No supraclavicular adenopathy present.  Neurological: She is alert and oriented to person, place, and time.  Skin: Skin is warm and dry.  Psychiatric: She has a normal mood and affect.  Vitals reviewed.  Results for orders placed or performed in visit on 06/15/16 (from the past 24 hour(s))  POCT CBC     Status: Abnormal   Collection Time: 06/15/16  1:33 PM  Result Value Ref Range   WBC 6.1 4.6 - 10.2 K/uL   Lymph, poc 1.9 0.6 - 3.4   POC LYMPH PERCENT 30.6 10 - 50 %L   MID (cbc) 0.6 0 - 0.9   POC MID % 10.1 0 -  12 %M   POC Granulocyte 3.6 2 - 6.9   Granulocyte percent 59.3 37 - 80 %G   RBC 5.35 4.04 - 5.48 M/uL   Hemoglobin 16.6 (A) 12.2 - 16.2 g/dL   HCT, POC 13.245.2 44.037.7 - 47.9 %   MCV 84.6 80 - 97 fL   MCH, POC 31.1 27 - 31.2 pg   MCHC 36.8 (A) 31.8 - 35.4 g/dL   RDW, POC 10.212.0 %   Platelet Count, POC 232 142 - 424 K/uL   MPV 6.5 0 - 99.8 fL  POCT Influenza A/B     Status: None   Collection Time: 06/15/16  1:42 PM  Result Value Ref Range   Influenza A, POC Negative Negative   Influenza B,  POC Negative Negative   Dg Chest 2 View  Result Date: 06/15/2016 CLINICAL DATA:  Cough and congestion with three-day history of hemoptysis EXAM: CHEST  2 VIEW COMPARISON:  February 19, 2011 FINDINGS: The lungs are clear. The heart size and pulmonary vascularity are normal. No adenopathy. No bone lesions. IMPRESSION: No abnormality noted. Electronically Signed   By: Bretta BangWilliam  Woodruff III M.D.   On: 06/15/2016 13:28    Assessment and Plan :  1. Acute upper respiratory infection -Likely viral, will treat supportively. -Follow up in 7 days if no improvement.   2. Sore throat - POCT Influenza A/B - Epstein-Barr virus VCA antibody panel - magic mouthwash w/lidocaine SOLN; Take 10 mLs by mouth 4 (four) times daily as needed for mouth pain.  Dispense: 360 mL; Refill: 0  3. Cough -Likely viral bronchitis, will treat supportively  - POCT Influenza A/B - DG Chest 2 View; Future - POCT CBC - benzonatate (TESSALON) 100 MG capsule; Take 1-2 capsules (100-200 mg total) by mouth 3 (three) times daily as needed for cough.  Dispense: 40 capsule; Refill: 0 - chlorpheniramine-HYDROcodone (TUSSIONEX PENNKINETIC ER) 10-8 MG/5ML SUER; Take 5 mLs by mouth every 12 (twelve) hours as needed for cough.  Dispense: 100 mL; Refill: 0  4. Myalgia - POCT Influenza A/B   Benjiman CoreBrittany Gerrie Castiglia, PA-C  [facility] 06/15/2016 1:44 PM

## 2016-06-15 NOTE — Patient Instructions (Addendum)
-   We will treat this as a respiratory viral infection.  - I recommend you rest, drink plenty of fluids, eat light meals including soups. I would also drink hot tea with honey, lemon, and ginger. - You may use cough syrup at night for your cough and sore throat, Tessalon pearls during the day. Be aware that cough syrup can definitely make you drowsy and sleepy so do not drive or operate any heavy machinery if it is affecting you during the day.  - You may also use magic mouth wash,  Tylenol or ibuprofen over-the-counter for your sore throat.  - Please let me know if you are not seeing any improvement or get worse in 7 days.   IF you received an x-ray today, you will receive an invoice from [facility]. Please contact [facility] at [phone] with questions or concerns regarding your invoice.   IF you received labwork today, you will receive an invoice from [facility]. Please contact [facility] at [phone] with questions or concerns regarding your invoice.   Our billing staff will not be able to assist you with questions regarding bills from these companies.  You will be contacted with the lab results as soon as they are available. The fastest way to get your results is to activate your My Chart account. Instructions are located on the last page of this paperwork. If you have not heard from us regarding the results in 2 weeks, please contact this office.

## 2016-06-16 LAB — EPSTEIN-BARR VIRUS VCA ANTIBODY PANEL
EBV NA IGG: 125 U/mL — AB
EBV VCA IgG: 116 U/mL — ABNORMAL HIGH
EBV VCA IgM: <36 U/mL

## 2016-07-28 ENCOUNTER — Ambulatory Visit (INDEPENDENT_AMBULATORY_CARE_PROVIDER_SITE_OTHER): Payer: BLUE CROSS/BLUE SHIELD

## 2016-07-28 VITALS — BP 116/60 | HR 80 | Ht 65.0 in | Wt 176.0 lb

## 2016-07-28 DIAGNOSIS — Z3042 Encounter for surveillance of injectable contraceptive: Secondary | ICD-10-CM

## 2016-07-28 MED ORDER — MEDROXYPROGESTERONE ACETATE 150 MG/ML IM SUSP
150.0000 mg | Freq: Once | INTRAMUSCULAR | Status: AC
  Administered 2016-07-28: 150 mg via INTRAMUSCULAR

## 2016-07-28 NOTE — Progress Notes (Signed)
Patient is here for Depo Provera Injection Patient is within Depo Provera Calender Limits 11/30-12/14 Next Depo Due between: 2/16-3/2 Last AEX: 11/17/15 PG AEX Scheduled: 12/04/16  Patient is aware when next depo is due  Pt tolerated Injection well.  Routed to provider for review, encounter closed.

## 2016-08-29 DIAGNOSIS — Z86018 Personal history of other benign neoplasm: Secondary | ICD-10-CM | POA: Diagnosis not present

## 2016-08-29 DIAGNOSIS — D2230 Melanocytic nevi of unspecified part of face: Secondary | ICD-10-CM | POA: Diagnosis not present

## 2016-08-29 DIAGNOSIS — D1801 Hemangioma of skin and subcutaneous tissue: Secondary | ICD-10-CM | POA: Diagnosis not present

## 2016-08-29 DIAGNOSIS — L814 Other melanin hyperpigmentation: Secondary | ICD-10-CM | POA: Diagnosis not present

## 2016-10-11 IMAGING — MR MR KNEE*R* W/O CM
5 of 6 series · 31 of 40 positions shown · non-contrast
Comparison: Plain films of the right knee 09/07/2015.

CLINICAL DATA: Status post patellar dislocation 1 week ago when
getting out of a car. Continued pain. Initial encounter.

EXAM:
MRI OF THE RIGHT KNEE WITHOUT CONTRAST
TECHNIQUE: Multiplanar, multisequence MR imaging of the knee was performed. No
intravenous contrast was administered.

[Series 6: PD fat-sat · axial · right · 3.0mm · 0.39mm/px · z∈[-29,+86]mm · 8 of 36 slices shown (1 of 3)]
[im 1/36]
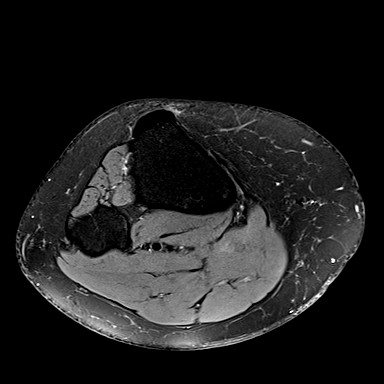
[im 6/36]
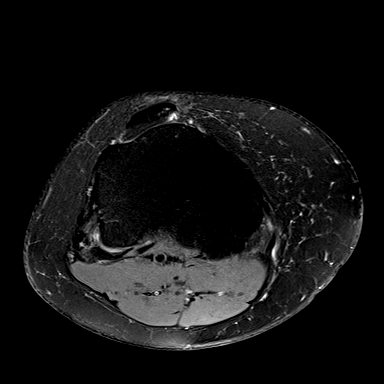
[im 11/36]
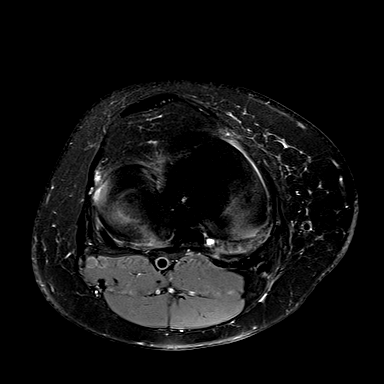
[im 16/36]
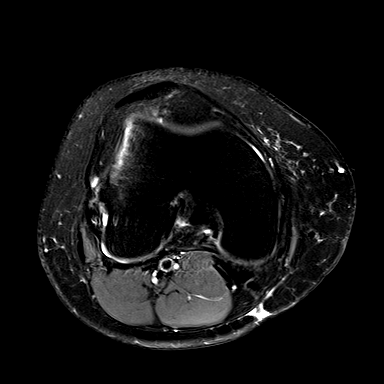
[im 21/36]
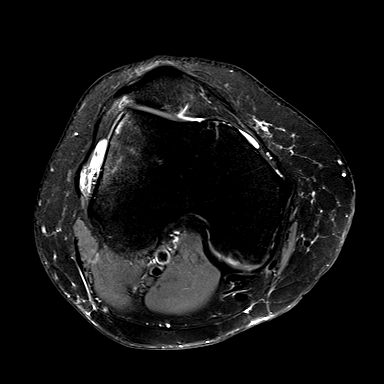
[im 26/36]
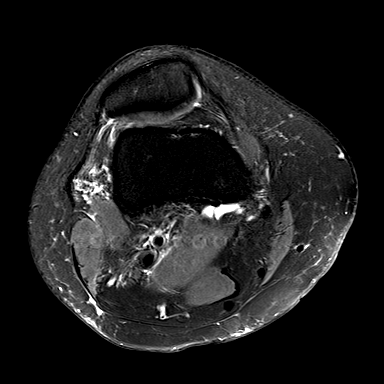
[im 31/36]
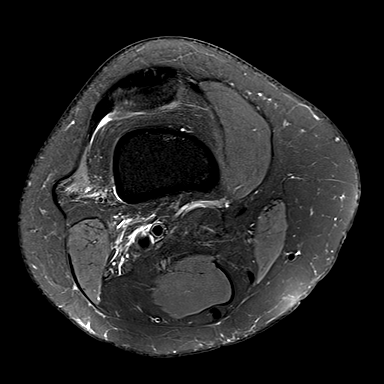
[im 36/36]
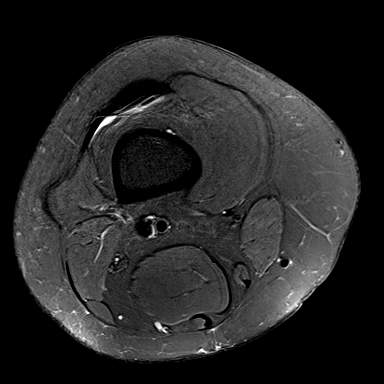

[Series 8: PD fat-sat · coronal · right · 3.0mm · 0.33mm/px · 7 of 29 slices shown (2 of 3)]
[im 1/29]
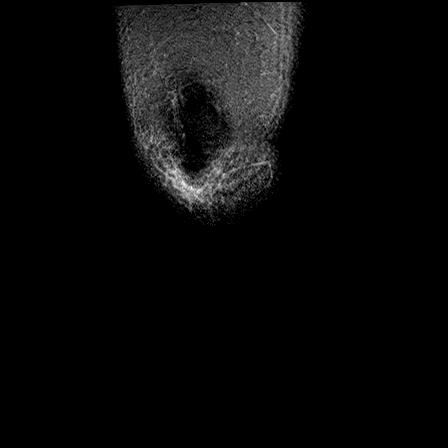
[im 5/29]
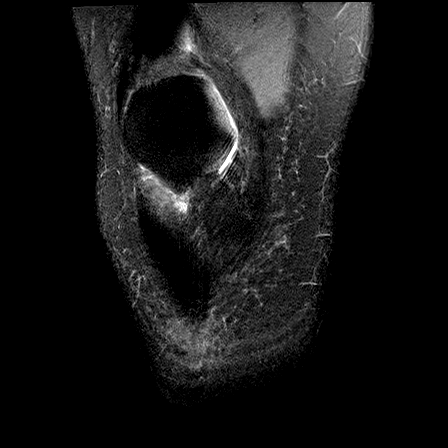
[im 10/29]
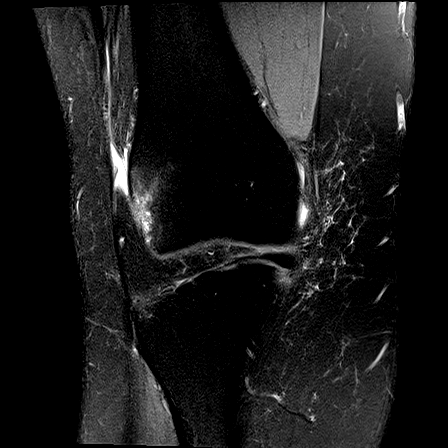
[im 15/29]
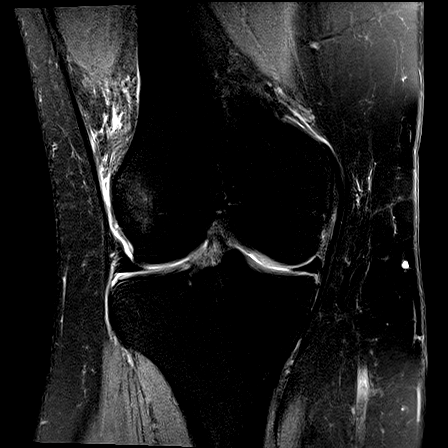
[im 19/29]
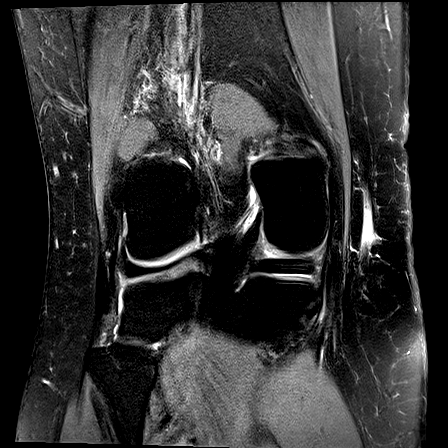
[im 24/29]
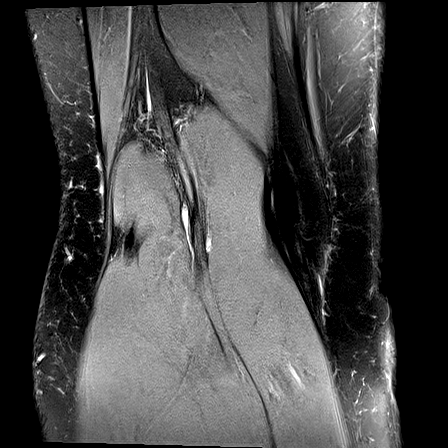
[im 29/29]
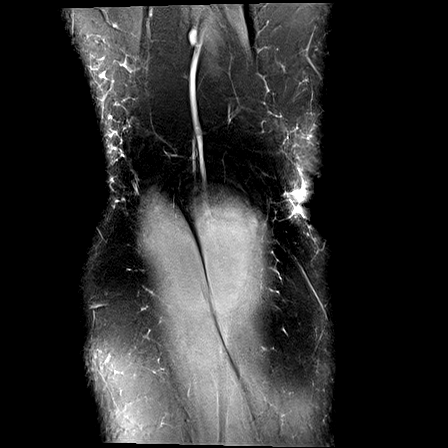

[Series 9: PD fat-sat · sagittal · right · 3.0mm · 0.39mm/px · 6 of 28 slices shown (3 of 3)]
[im 1/28]
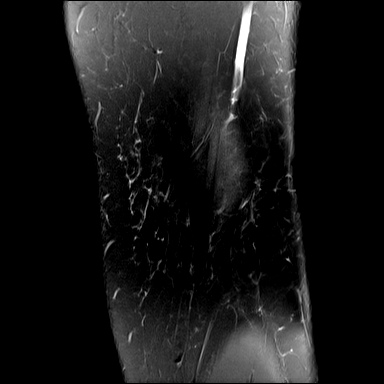
[im 6/28]
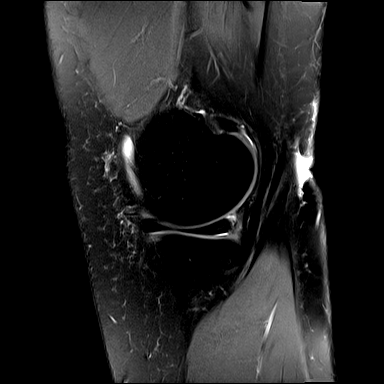
[im 11/28]
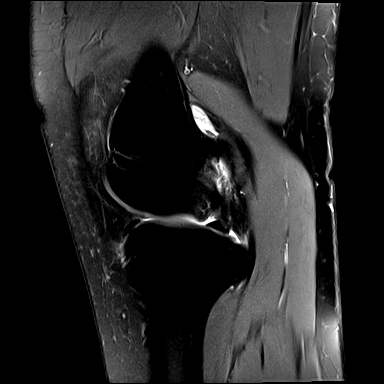
[im 17/28]
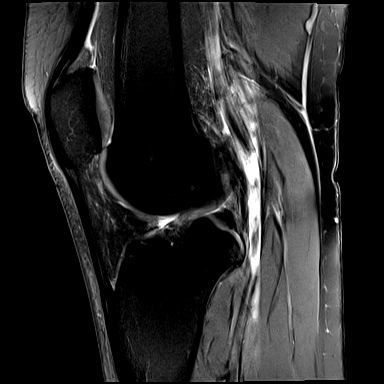
[im 22/28]
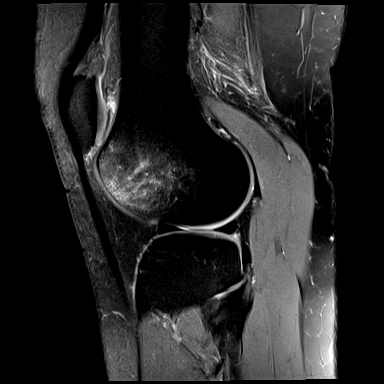
[im 28/28]
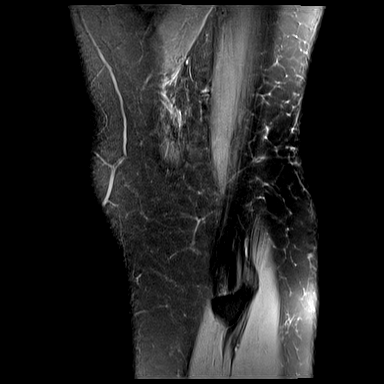

[Series 10: T2 fat-sat · coronal · right · 3.0mm · 0.33mm/px · 5 of 29 slices shown]
[im 1/29]
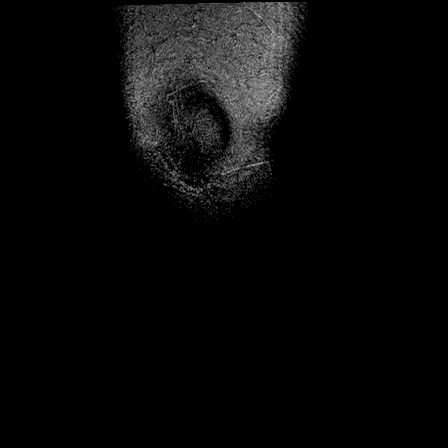
[im 5/29]
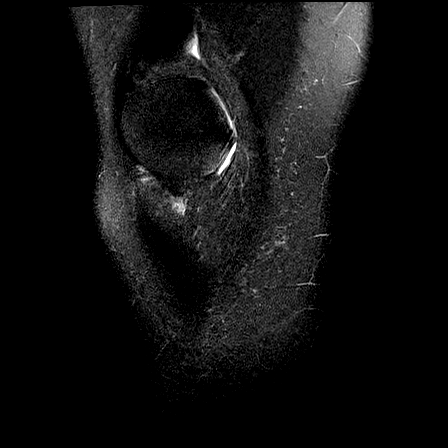
[im 10/29]
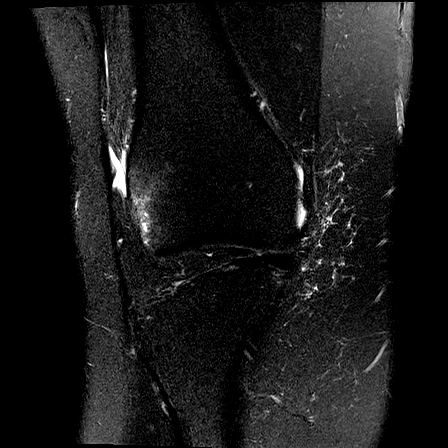
[im 15/29]
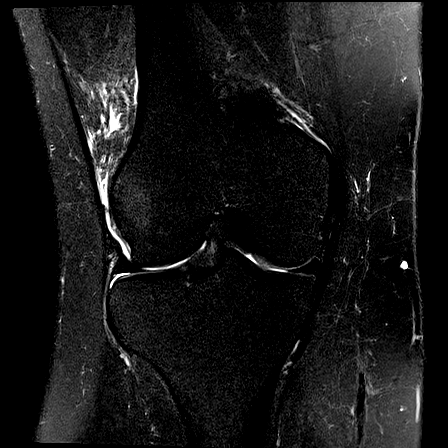
[im 19/29]
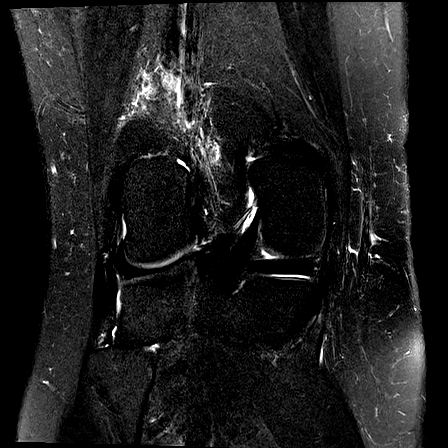

[Series 11: PD · coronal · right · 1.5mm · 0.44mm/px · 5 of 23 slices shown]
[im 1/23]
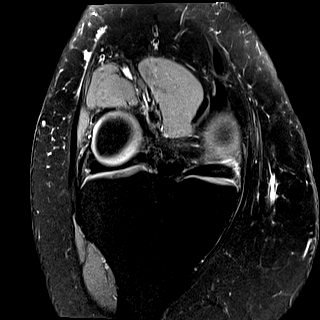
[im 6/23]
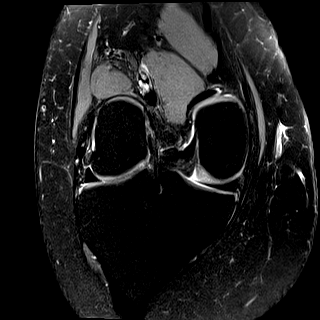
[im 12/23]
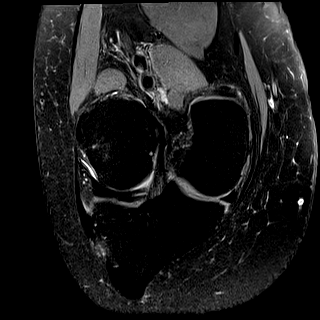
[im 17/23]
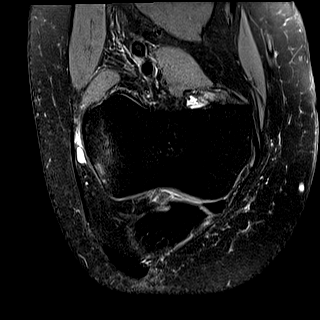
[im 23/23]
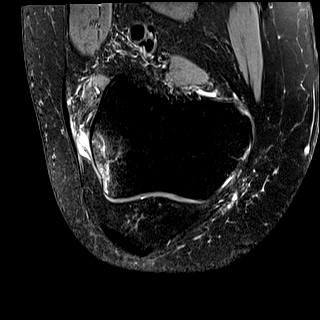

[31 of 40 positions shown; findings below may reference images not displayed]

FINDINGS: MENISCI

Medial meniscus:  Intact.

Lateral meniscus:  Intact.

LIGAMENTS

Cruciates:  Intact.

Collaterals:  Intact.

CARTILAGE

Patellofemoral: Hyaline cartilage at the inferior pole of the
patella along the medial facet is denuded.

Medial:  Unremarkable.

Lateral:  Unremarkable.

Joint:  Very small effusion.

Popliteal Fossa:  No Baker's cyst.

Extensor Mechanism: The medial patellofemoral ligament and remainder
of the extensor mechanism is intact. The patient has a shallow
trochlear groove and markedly diminutive medial patellar facet.
Lateral tilt of the patella is identified. The tibial tubercle -
trochlear groove distance is at the upper limits of normal at
cm.

Bones: There is a mild impaction fracture in the lateral femoral
condyle with associated marrow edema. Bone contusion in the
periphery of the medial patellar facet is identified.
IMPRESSION: Findings consistent with transient lateral dislocation of the
patella. The MPFL and remainder of the extensor mechanism is intact.
Markedly diminutive medial patellar facet and shallow trochlear
groove predispose to dislocation.

## 2016-10-17 ENCOUNTER — Ambulatory Visit (INDEPENDENT_AMBULATORY_CARE_PROVIDER_SITE_OTHER): Payer: BLUE CROSS/BLUE SHIELD

## 2016-10-17 VITALS — BP 102/60 | HR 84 | Resp 16 | Wt 169.0 lb

## 2016-10-17 DIAGNOSIS — Z3042 Encounter for surveillance of injectable contraceptive: Secondary | ICD-10-CM

## 2016-10-17 MED ORDER — MEDROXYPROGESTERONE ACETATE 150 MG/ML IM SUSP
150.0000 mg | Freq: Once | INTRAMUSCULAR | Status: AC
  Administered 2016-10-17: 150 mg via INTRAMUSCULAR

## 2016-10-17 NOTE — Progress Notes (Signed)
Patient is here for Depo Provera Injection Patient is within Depo Provera Calender Limits 2/16-3/2 Next Depo Due between: 5/8-5/22 Last AEX: 11/17/15 PG AEX Scheduled: 12/04/16  Patient is aware when next depo is due  Pt tolerated Injection well in RUOQ.  Routed to provider for review, encounter closed.

## 2016-11-03 DIAGNOSIS — R040 Epistaxis: Secondary | ICD-10-CM | POA: Diagnosis not present

## 2016-11-15 DIAGNOSIS — J020 Streptococcal pharyngitis: Secondary | ICD-10-CM | POA: Diagnosis not present

## 2016-12-04 ENCOUNTER — Encounter: Payer: Self-pay | Admitting: Nurse Practitioner

## 2016-12-04 ENCOUNTER — Ambulatory Visit (INDEPENDENT_AMBULATORY_CARE_PROVIDER_SITE_OTHER): Payer: BLUE CROSS/BLUE SHIELD | Admitting: Nurse Practitioner

## 2016-12-04 VITALS — BP 110/66 | HR 76 | Ht 65.25 in | Wt 171.0 lb

## 2016-12-04 DIAGNOSIS — Z0000 Encounter for general adult medical examination without abnormal findings: Secondary | ICD-10-CM

## 2016-12-04 DIAGNOSIS — Z01419 Encounter for gynecological examination (general) (routine) without abnormal findings: Secondary | ICD-10-CM | POA: Diagnosis not present

## 2016-12-04 DIAGNOSIS — Z23 Encounter for immunization: Secondary | ICD-10-CM

## 2016-12-04 DIAGNOSIS — Z3042 Encounter for surveillance of injectable contraceptive: Secondary | ICD-10-CM

## 2016-12-04 NOTE — Patient Instructions (Signed)
General topics  Next pap or exam is  due in 1 year Take a Women's multivitamin Take 1200 mg. of calcium daily - prefer dietary If any concerns in interim to call back  Breast Self-Awareness Practicing breast self-awareness may pick up problems early, prevent significant medical complications, and possibly save your life. By practicing breast self-awareness, you can become familiar with how your breasts look and feel and if your breasts are changing. This allows you to notice changes early. It can also offer you some reassurance that your breast health is good. One way to learn what is normal for your breasts and whether your breasts are changing is to do a breast self-exam. If you find a lump or something that was not present in the past, it is best to contact your caregiver right away. Other findings that should be evaluated by your caregiver include nipple discharge, especially if it is bloody; skin changes or reddening; areas where the skin seems to be pulled in (retracted); or new lumps and bumps. Breast pain is seldom associated with cancer (malignancy), but should also be evaluated by a caregiver. BREAST SELF-EXAM The best time to examine your breasts is 5 7 days after your menstrual period is over.  ExitCare Patient Information 2013 [employer].   Exercise to Stay Healthy Exercise helps you become and stay healthy. EXERCISE IDEAS AND TIPS Choose exercises that:  You enjoy.  Fit into your day. You do not need to exercise really hard to be healthy. You can do exercises at a slow or medium level and stay healthy. You can:  Stretch before and after working out.  Try yoga, Pilates, or tai chi.  Lift weights.  Walk fast, swim, jog, run, climb stairs, bicycle, dance, or rollerskate.  Take aerobic classes. Exercises that burn about 150 calories:  Running 1  miles in 15 minutes.  Playing volleyball for 45 to 60 minutes.  Washing and waxing a car for 45 to 60  minutes.  Playing touch football for 45 minutes.  Walking 1  miles in 35 minutes.  Pushing a stroller 1  miles in 30 minutes.  Playing basketball for 30 minutes.  Raking leaves for 30 minutes.  Bicycling 5 miles in 30 minutes.  Walking 2 miles in 30 minutes.  Dancing for 30 minutes.  Shoveling snow for 15 minutes.  Swimming laps for 20 minutes.  Walking up stairs for 15 minutes.  Bicycling 4 miles in 15 minutes.  Gardening for 30 to 45 minutes.  Jumping rope for 15 minutes.  Washing windows or floors for 45 to 60 minutes. Document Released: 09/16/2010 Document Revised: 11/06/2011 Document Reviewed: 09/16/2010 ExitCare Patient Information 2013 ExitCare[employer].   Other topics ( that may be useful information):    Sexually Transmitted Disease Sexually transmitted disease (STD) refers to any infection that is passed from person to person during sexual activity. This may happen by way of saliva, semen, blood, vaginal mucus, or urine. Common STDs include:  Gonorrhea.  Chlamydia.  Syphilis.  HIV/AIDS.  Genital herpes.  Hepatitis B and C.  Trichomonas.  Human papillomavirus (HPV).  Pubic lice. CAUSES  An STD may be spread by bacteria, virus, or parasite. A person can get an STD by:  Sexual intercourse with an infected person.  Sharing sex toys with an infected person.  Sharing needles with an infected person.  Having intimate contact with the genitals, mouth, or rectal areas of an infected person. SYMPTOMS  Some people may not have any symptoms, but   they can still pass the infection to others. Different STDs have different symptoms. Symptoms include:  Painful or bloody urination.  Pain in the pelvis, abdomen, vagina, anus, throat, or eyes.  Skin rash, itching, irritation, growths, or sores (lesions). These usually occur in the genital or anal area.  Abnormal vaginal discharge.  Penile discharge in men.  Soft, flesh-colored skin growths in the  genital or anal area.  Fever.  Pain or bleeding during sexual intercourse.  Swollen glands in the groin area.  Yellow skin and eyes (jaundice). This is seen with hepatitis. DIAGNOSIS  To make a diagnosis, your caregiver may:  Take a medical history.  Perform a physical exam.  Take a specimen (culture) to be examined.  Examine a sample of discharge under a microscope.  Perform blood test TREATMENT   Chlamydia, gonorrhea, trichomonas, and syphilis can be cured with antibiotic medicine.  Genital herpes, hepatitis, and HIV can be treated, but not cured, with prescribed medicines. The medicines will lessen the symptoms.  Genital warts from HPV can be treated with medicine or by freezing, burning (electrocautery), or surgery. Warts may come back.  HPV is a virus and cannot be cured with medicine or surgery.However, abnormal areas may be followed very closely by your caregiver and may be removed from the cervix, vagina, or vulva through office procedures or surgery. If your diagnosis is confirmed, your recent sexual partners need treatment. This is true even if they are symptom-free or have a negative culture or evaluation. They should not have sex until their caregiver says it is okay. HOME CARE INSTRUCTIONS  All sexual partners should be informed, tested, and treated for all STDs.  Take your antibiotics as directed. Finish them even if you start to feel better.  Only take over-the-counter or prescription medicines for pain, discomfort, or fever as directed by your caregiver.  Rest.  Eat a balanced diet and drink enough fluids to keep your urine clear or pale yellow.  Do not have sex until treatment is completed and you have followed up with your caregiver. STDs should be checked after treatment.  Keep all follow-up appointments, Pap tests, and blood tests as directed by your caregiver.  Only use latex condoms and water-soluble lubricants during sexual activity. Do not use  petroleum jelly or oils.  Avoid alcohol and illegal drugs.  Get vaccinated for HPV and hepatitis. If you have not received these vaccines in the past, talk to your caregiver about whether one or both might be right for you.  Avoid risky sex practices that can break the skin. The only way to avoid getting an STD is to avoid all sexual activity.Latex condoms and dental dams (for oral sex) will help lessen the risk of getting an STD, but will not completely eliminate the risk. SEEK MEDICAL CARE IF:   You have a fever.  You have any new or worsening symptoms. Document Released: 11/04/2002 Document Revised: 11/06/2011 Document Reviewed: 11/11/2010 [facility] Patient Information 2013 [place].    Domestic Abuse You are being battered or abused if someone close to you hits, pushes, or physically hurts you in any way. You also are being abused if you are forced into activities. You are being sexually abused if you are forced to have sexual contact of any kind. You are being emotionally abused if you are made to feel worthless or if you are constantly threatened. It is important to remember that help is available. No one has the right to abuse you. PREVENTION OF FURTHER  ABUSE  Learn the warning signs of danger. This varies with situations but may include: the use of alcohol, threats, isolation from friends and family, or forced sexual contact. Leave if you feel that violence is going to occur.  If you are attacked or beaten, report it to the police so the abuse is documented. You do not have to press charges. The police can protect you while you or the attackers are leaving. Get the officer's name and badge number and a copy of the report.  Find someone you can trust and tell them what is happening to you: your caregiver, a nurse, clergy member, close friend or family member. Feeling ashamed is natural, but remember that you have done nothing wrong. No one deserves abuse. Document Released:  08/11/2000 Document Revised: 11/06/2011 Document Reviewed: 10/20/2010 ExitCare Patient Information 2013 [employer]Care[employer].    How Much is Too Much Alcohol? Drinking too much alcohol can cause injury, accidents, and health problems. These types of problems can include:   Car crashes.  Falls.  Family fighting (domestic violence).  Drowning.  Fights.  Injuries.  Burns.  Damage to certain organs.  Having a baby with birth defects. ONE DRINK CAN BE TOO MUCH WHEN YOU ARE:  Working.  Pregnant or breastfeeding.  Taking medicines. Ask your doctor.  Driving or planning to drive. If you or someone you know has a drinking problem, get help from a doctor.  Document Released: 06/10/2009 Document Revised: 11/06/2011 Document Reviewed: 06/10/2009 ExitCare Patient Information 2013 ExitCare[employer].   Smoking Hazards Smoking cigarettes is extremely bad for your health. Tobacco smoke has over 200 known poisons in it. There are over 60 chemicals in tobacco smoke that cause cancer. Some of the chemicals found in cigarette smoke include:   Cyanide.  Benzene.  Formaldehyde.  Methanol (wood alcohol).  Acetylene (fuel used in welding torches).  Ammonia. Cigarette smoke also contains the poisonous gases nitrogen oxide and carbon monoxide.  Cigarette smokers have an increased risk of many serious medical problems and Smoking causes approximately:  90% of all lung cancer deaths in men.  80% of all lung cancer deaths in women.  90% of deaths from chronic obstructive lung disease. Compared with nonsmokers, smoking increases the risk of:  Coronary heart disease by 2 to 4 times.  Stroke by 2 to 4 times.  Men developing lung cancer by 23 times.  Women developing lung cancer by 13 times.  Dying from chronic obstructive lung diseases by 12 times.  . Smoking is the most preventable cause of death and disease in our society.  WHY IS SMOKING ADDICTIVE?  Nicotine is the chemical  agent in tobacco that is capable of causing addiction or dependence.  When you smoke and inhale, nicotine is absorbed rapidly into the bloodstream through your lungs. Nicotine absorbed through the lungs is capable of creating a powerful addiction. Both inhaled and non-inhaled nicotine may be addictive.  Addiction studies of cigarettes and spit tobacco show that addiction to nicotine occurs mainly during the teen years, when young people begin using tobacco products. WHAT ARE THE BENEFITS OF QUITTING?  There are many health benefits to quitting smoking.   Likelihood of developing cancer and heart disease decreases. Health improvements are seen almost immediately.  Blood pressure, pulse rate, and breathing patterns start returning to normal soon after quitting. QUITTING SMOKING   American Lung Association - 1-800-LUNGUSA  American Cancer Society - 1-800-ACS-2345 Document Released: 09/21/2004 Document Revised: 11/06/2011 Document Reviewed: 05/26/2009 ExitCare Patient Information 2013 ExitCare,   LLC.   Stress Management Stress is a state of physical or mental tension that often results from changes in your life or normal routine. Some common causes of stress are:  Death of a loved one.  Injuries or severe illnesses.  Getting fired or changing jobs.  Moving into a new home. Other causes may be:  Sexual problems.  Business or financial losses.  Taking on a large debt.  Regular conflict with someone at home or at work.  Constant tiredness from lack of sleep. It is not just bad things that are stressful. It may be stressful to:  Win the lottery.  Get married.  Buy a new car. The amount of stress that can be easily tolerated varies from person to person. Changes generally cause stress, regardless of the types of change. Too much stress can affect your health. It may lead to physical or emotional problems. Too little stress (boredom) may also become stressful. SUGGESTIONS TO  REDUCE STRESS:  Talk things over with your family and friends. It often is helpful to share your concerns and worries. If you feel your problem is serious, you may want to get help from a professional counselor.  Consider your problems one at a time instead of lumping them all together. Trying to take care of everything at once may seem impossible. List all the things you need to do and then start with the most important one. Set a goal to accomplish 2 or 3 things each day. If you expect to do too many in a single day you will naturally fail, causing you to feel even more stressed.  Do not use alcohol or drugs to relieve stress. Although you may feel better for a short time, they do not remove the problems that caused the stress. They can also be habit forming.  Exercise regularly - at least 3 times per week. Physical exercise can help to relieve that "uptight" feeling and will relax you.  The shortest distance between despair and hope is often a good night's sleep.  Go to bed and get up on time allowing yourself time for appointments without being rushed.  Take a short "time-out" period from any stressful situation that occurs during the day. Close your eyes and take some deep breaths. Starting with the muscles in your face, tense them, hold it for a few seconds, then relax. Repeat this with the muscles in your neck, shoulders, hand, stomach, back and legs.  Take good care of yourself. Eat a balanced diet and get plenty of rest.  Schedule time for having fun. Take a break from your daily routine to relax. HOME CARE INSTRUCTIONS   Call if you feel overwhelmed by your problems and feel you can no longer manage them on your own.  Return immediately if you feel like hurting yourself or someone else. Document Released: 02/07/2001 Document Revised: 11/06/2011 Document Reviewed: 09/30/2007 ExitCare Patient Information 2013 ExitCare, LLC.  

## 2016-12-04 NOTE — Progress Notes (Signed)
21 y.o. G0P0000 Single  Caucasian Fe here for annual exam.  Still likes Depo Provera for birth control.  Recent yeast vaginitis treated with OTC medication following Amoxil therapy.  Same partner since last year.  Next Depo  injection is due 01/03/17.  Has history of migraine HA's without aura since middle school - these seem to be related to stress and was not increased with menses.  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  Still works at [employer].  Patient's last menstrual period was 07/18/2011 (exact date).          Sexually active: Yes.    The current method of family planning is condoms never and Depo-Provera injections.  Same partner x 1 year. Exercising: Yes.    running and [employer] 3 days per week Smoker:  no  Health Maintenance: Pap: 11/17/15, Negative (First pap) TDaP: 2008 Gardasil:  completed in 2013 HIV: 11/17/15 Labs: Decline   reports that she has never smoked. She has never used smokeless tobacco. She reports that she drinks about 1.8 oz of alcohol per week . She reports that she does not use drugs.  Past Medical History:  Diagnosis Date  . Allergy   . Irregular menses since age 64    Past Surgical History:  Procedure Laterality Date  . COSMETIC SURGERY    . KNEE SURGERY Left 12/2010   patella realignment and lateral release  . MOLE REMOVAL     left ear and right leg, performed by plastic surgeon under anesthesia  . PATELLAR TENDON REPAIR Right 09/2015  . WISDOM TOOTH EXTRACTION  February 2016    Current Outpatient Prescriptions  Medication Sig Dispense Refill  . medroxyPROGESTERone (DEPO-PROVERA) 150 MG/ML injection Inject 1 mL (150 mg total) into the muscle every 3 (three) months. 1 mL 3  . naproxen (NAPROSYN) 500 MG tablet Take 1 tablet (500 mg total) by mouth 2 (two) times daily as needed for mild pain, moderate pain or headache (TAKE WITH MEALS.). 20 tablet 0   No current facility-administered medications for this visit.     Family  History  Problem Relation Age of Onset  . COPD Maternal Grandfather     ROS:  Pertinent items are noted in HPI.  Otherwise, a comprehensive ROS was negative.  Exam:   BP 110/66 (BP Location: Right Arm, Patient Position: Sitting, Cuff Size: Normal)   Pulse 76   Ht 5' 5.25" (1.657 m)   Wt 171 lb (77.6 kg)   LMP 07/18/2011 (Exact Date)   BMI 28.24 kg/m  Height: 5' 5.25" (165.7 cm) Ht Readings from Last 3 Encounters:  12/04/16 5' 5.25" (1.657 m)  07/28/16  (1.651 m)  06/15/16 5' 5.5" (1.664 m)    General appearance: alert, cooperative and appears stated age Head: Normocephalic, without obvious abnormality, atraumatic Neck: no adenopathy, supple, symmetrical, trachea midline and thyroid normal to inspection and palpation Lungs: clear to auscultation bilaterally Breasts: normal appearance, no masses or tenderness Heart: regular rate and rhythm Abdomen: soft, non-tender; no masses,  no organomegaly Extremities: extremities normal, atraumatic, no cyanosis or edema Skin: Skin color, texture, turgor normal. No rashes or lesions Lymph nodes: Cervical, supraclavicular, and axillary nodes normal. No abnormal inguinal nodes palpated Neurologic: Grossly normal   Pelvic: External genitalia:  no lesions              Urethra:  normal appearing urethra with no masses, tenderness or lesions  Bartholin's and Skene's: normal                 Vagina: normal appearing vagina with normal color and no discharge, no lesions              Cervix: anteverted              Pap taken: No. Bimanual Exam:  Uterus:  normal size, contour, position, consistency, mobility, non-tender              Adnexa: no mass, fullness, tenderness               Rectovaginal: declines     Chaperone present: yes  A:  Well Woman with normal exam  Contraception with Depo Provera             History of irregular menses prior to Depo                   History of migraine headaches without aura              S/P Allograft right meniscus 09/2015  Update immunization  P:   Reviewed health and wellness pertinent to exam  Pap smear not done  Will continue with Depo Provera as directed  TDaP given today  Counseled on breast self exam, STD prevention, HIV risk factors and prevention, adequate intake of calcium and vitamin D, diet and exercise return annually or prn  An After Visit Summary was printed and given to the patient.

## 2016-12-06 DIAGNOSIS — R040 Epistaxis: Secondary | ICD-10-CM | POA: Insufficient documentation

## 2016-12-06 DIAGNOSIS — J343 Hypertrophy of nasal turbinates: Secondary | ICD-10-CM | POA: Diagnosis not present

## 2016-12-07 NOTE — Progress Notes (Signed)
Encounter reviewed by Dr. Brook Amundson C. Silva.  

## 2016-12-26 DIAGNOSIS — R040 Epistaxis: Secondary | ICD-10-CM | POA: Diagnosis not present

## 2017-01-04 ENCOUNTER — Ambulatory Visit (INDEPENDENT_AMBULATORY_CARE_PROVIDER_SITE_OTHER): Payer: BLUE CROSS/BLUE SHIELD

## 2017-01-04 ENCOUNTER — Ambulatory Visit: Payer: BLUE CROSS/BLUE SHIELD

## 2017-01-04 VITALS — BP 114/72 | HR 68 | Resp 16 | Ht 65.25 in | Wt 175.0 lb

## 2017-01-04 DIAGNOSIS — Z3042 Encounter for surveillance of injectable contraceptive: Secondary | ICD-10-CM | POA: Diagnosis not present

## 2017-01-04 MED ORDER — MEDROXYPROGESTERONE ACETATE 150 MG/ML IM SUSP
150.0000 mg | Freq: Once | INTRAMUSCULAR | Status: AC
  Administered 2017-01-04: 150 mg via INTRAMUSCULAR

## 2017-01-04 NOTE — Progress Notes (Signed)
Patient is here for Depo Provera Injection Patient is within Depo Provera Calender Limits Yes, 10/17/16 5/8 - 5/22 Next Depo Due between: 7/26 - 8/9 Last AEX: 12/04/16 PG AEX Scheduled: 12/05/17 PG  Patient is aware when next depo is due  Pt tolerated Injection well. Injection given in LUOQ.   Routed to provider for review, encounter closed.

## 2017-03-23 ENCOUNTER — Telehealth: Payer: Self-pay | Admitting: Obstetrics and Gynecology

## 2017-03-23 NOTE — Telephone Encounter (Signed)
Left message for patient to reschedule Patty Grubb appt. °

## 2017-03-27 ENCOUNTER — Telehealth: Payer: Self-pay | Admitting: Obstetrics & Gynecology

## 2017-03-27 ENCOUNTER — Ambulatory Visit (INDEPENDENT_AMBULATORY_CARE_PROVIDER_SITE_OTHER): Payer: BLUE CROSS/BLUE SHIELD | Admitting: *Deleted

## 2017-03-27 ENCOUNTER — Ambulatory Visit: Payer: BLUE CROSS/BLUE SHIELD

## 2017-03-27 VITALS — BP 118/78 | HR 72 | Resp 14 | Wt 181.0 lb

## 2017-03-27 DIAGNOSIS — Z308 Encounter for other contraceptive management: Secondary | ICD-10-CM

## 2017-03-27 MED ORDER — MEDROXYPROGESTERONE ACETATE 150 MG/ML IM SUSP
150.0000 mg | Freq: Once | INTRAMUSCULAR | Status: AC
  Administered 2017-03-27: 150 mg via INTRAMUSCULAR

## 2017-03-27 NOTE — Progress Notes (Signed)
Patient is here for Depo Provera Injection Patient is within Depo Provera Calender Limits: Yes, 01/04/17-  07/26- 08/09 Next Depo Due between: 10/16- 10/30 Last AEX: 12/04/16 PG  AEX Scheduled: Not scheduled, 02 recall in place.   Patient is aware when next depo is due  Pt tolerated Injection well.  Routed to provider for review, encounter closed.

## 2017-03-27 NOTE — Telephone Encounter (Signed)
Left message for patient to reschedule depo appointment.

## 2017-06-12 ENCOUNTER — Ambulatory Visit (INDEPENDENT_AMBULATORY_CARE_PROVIDER_SITE_OTHER): Payer: BLUE CROSS/BLUE SHIELD | Admitting: *Deleted

## 2017-06-12 VITALS — BP 100/60 | HR 96 | Resp 16 | Ht 65.25 in | Wt 181.0 lb

## 2017-06-12 DIAGNOSIS — Z3042 Encounter for surveillance of injectable contraceptive: Secondary | ICD-10-CM | POA: Diagnosis not present

## 2017-06-12 MED ORDER — MEDROXYPROGESTERONE ACETATE 150 MG/ML IM SUSP
150.0000 mg | Freq: Once | INTRAMUSCULAR | Status: AC
  Administered 2017-06-12: 150 mg via INTRAMUSCULAR

## 2017-06-12 NOTE — Progress Notes (Signed)
Patient is here for Depo Provera Injection Patient is within Depo Provera Calender Limits: yes, last depo 03/27/17  Next Depo Due between: 1/1-1/15 Last AEX: 12/04/16 PG AEX Scheduled: 12/05/17 DL  Patient is aware when next depo is due  Pt tolerated Injection well. Left gluteus today.   Routed to provider for review, encounter closed.

## 2017-06-25 DIAGNOSIS — L219 Seborrheic dermatitis, unspecified: Secondary | ICD-10-CM | POA: Diagnosis not present

## 2017-06-25 DIAGNOSIS — R61 Generalized hyperhidrosis: Secondary | ICD-10-CM | POA: Diagnosis not present

## 2017-08-29 ENCOUNTER — Ambulatory Visit (INDEPENDENT_AMBULATORY_CARE_PROVIDER_SITE_OTHER): Payer: BLUE CROSS/BLUE SHIELD | Admitting: *Deleted

## 2017-08-29 ENCOUNTER — Other Ambulatory Visit: Payer: Self-pay

## 2017-08-29 VITALS — BP 120/78 | HR 68 | Resp 14 | Wt 187.5 lb

## 2017-08-29 DIAGNOSIS — Z86018 Personal history of other benign neoplasm: Secondary | ICD-10-CM | POA: Diagnosis not present

## 2017-08-29 DIAGNOSIS — L814 Other melanin hyperpigmentation: Secondary | ICD-10-CM | POA: Diagnosis not present

## 2017-08-29 DIAGNOSIS — D2230 Melanocytic nevi of unspecified part of face: Secondary | ICD-10-CM | POA: Diagnosis not present

## 2017-08-29 DIAGNOSIS — Z308 Encounter for other contraceptive management: Secondary | ICD-10-CM

## 2017-08-29 DIAGNOSIS — D1801 Hemangioma of skin and subcutaneous tissue: Secondary | ICD-10-CM | POA: Diagnosis not present

## 2017-08-29 MED ORDER — MEDROXYPROGESTERONE ACETATE 150 MG/ML IM SUSP
150.0000 mg | Freq: Once | INTRAMUSCULAR | Status: AC
  Administered 2017-08-29: 150 mg via INTRAMUSCULAR

## 2017-08-29 NOTE — Progress Notes (Signed)
Patient is here for Depo Provera Injection Patient is within Depo Provera Calender Limits yes, last depo 06/12/17  Next Depo Due between: 3/20-4/3 Last AEX: 12/04/16 PG  AEX Scheduled: 12/05/17   Patient is aware when next depo is due  Pt tolerated Injection well. RUOQ.   Routed to provider for review, encounter closed.

## 2017-09-04 DIAGNOSIS — N390 Urinary tract infection, site not specified: Secondary | ICD-10-CM | POA: Diagnosis not present

## 2017-09-04 DIAGNOSIS — A084 Viral intestinal infection, unspecified: Secondary | ICD-10-CM | POA: Diagnosis not present

## 2017-09-04 DIAGNOSIS — R350 Frequency of micturition: Secondary | ICD-10-CM | POA: Diagnosis not present

## 2017-11-14 ENCOUNTER — Ambulatory Visit (INDEPENDENT_AMBULATORY_CARE_PROVIDER_SITE_OTHER): Payer: BLUE CROSS/BLUE SHIELD

## 2017-11-14 VITALS — BP 118/64 | HR 70 | Ht 65.25 in | Wt 179.0 lb

## 2017-11-14 DIAGNOSIS — Z3042 Encounter for surveillance of injectable contraceptive: Secondary | ICD-10-CM | POA: Diagnosis not present

## 2017-11-14 MED ORDER — MEDROXYPROGESTERONE ACETATE 150 MG/ML IM SUSP
150.0000 mg | Freq: Once | INTRAMUSCULAR | Status: AC
  Administered 2017-11-14: 150 mg via INTRAMUSCULAR

## 2017-11-14 NOTE — Progress Notes (Addendum)
Patient is here for Depo Provera Injection Patient is within Depo Provera Calender Limits Yes Next Depo Due between: 6/5 - 6/19 Last AEX: 12-14-16 AEX Scheduled: 12-05-17  Patient is aware when next depo is due  Pt tolerated Injection well in LUOQ.  Routed to provider for review, encounter closed.

## 2017-12-03 DIAGNOSIS — Z9889 Other specified postprocedural states: Secondary | ICD-10-CM | POA: Diagnosis not present

## 2017-12-03 DIAGNOSIS — M6281 Muscle weakness (generalized): Secondary | ICD-10-CM | POA: Diagnosis not present

## 2017-12-03 DIAGNOSIS — M25561 Pain in right knee: Secondary | ICD-10-CM | POA: Diagnosis not present

## 2017-12-05 ENCOUNTER — Ambulatory Visit (INDEPENDENT_AMBULATORY_CARE_PROVIDER_SITE_OTHER): Payer: BLUE CROSS/BLUE SHIELD | Admitting: Certified Nurse Midwife

## 2017-12-05 ENCOUNTER — Encounter: Payer: Self-pay | Admitting: Certified Nurse Midwife

## 2017-12-05 ENCOUNTER — Other Ambulatory Visit: Payer: Self-pay

## 2017-12-05 ENCOUNTER — Ambulatory Visit: Payer: BLUE CROSS/BLUE SHIELD | Admitting: Nurse Practitioner

## 2017-12-05 VITALS — BP 106/62 | HR 72 | Resp 16 | Ht 65.25 in | Wt 177.0 lb

## 2017-12-05 DIAGNOSIS — Z3042 Encounter for surveillance of injectable contraceptive: Secondary | ICD-10-CM

## 2017-12-05 DIAGNOSIS — Z01419 Encounter for gynecological examination (general) (routine) without abnormal findings: Secondary | ICD-10-CM | POA: Diagnosis not present

## 2017-12-05 NOTE — Patient Instructions (Signed)
General topics  Next pap or exam is  due in 1 year Take a Women's multivitamin Take 1200 mg. of calcium daily - prefer dietary If any concerns in interim to call back  Breast Self-Awareness Practicing breast self-awareness may pick up problems early, prevent significant medical complications, and possibly save your life. By practicing breast self-awareness, you can become familiar with how your breasts look and feel and if your breasts are changing. This allows you to notice changes early. It can also offer you some reassurance that your breast health is good. One way to learn what is normal for your breasts and whether your breasts are changing is to do a breast self-exam. If you find a lump or something that was not present in the past, it is best to contact your caregiver right away. Other findings that should be evaluated by your caregiver include nipple discharge, especially if it is bloody; skin changes or reddening; areas where the skin seems to be pulled in (retracted); or new lumps and bumps. Breast pain is seldom associated with cancer (malignancy), but should also be evaluated by a caregiver. BREAST SELF-EXAM The best time to examine your breasts is 5 7 days after your menstrual period is over.  ExitCare Patient Information 2013 [employer].   Exercise to Stay Healthy Exercise helps you become and stay healthy. EXERCISE IDEAS AND TIPS Choose exercises that:  You enjoy.  Fit into your day. You do not need to exercise really hard to be healthy. You can do exercises at a slow or medium level and stay healthy. You can:  Stretch before and after working out.  Try yoga, Pilates, or tai chi.  Lift weights.  Walk fast, swim, jog, run, climb stairs, bicycle, dance, or rollerskate.  Take aerobic classes. Exercises that burn about 150 calories:  Running 1  miles in 15 minutes.  Playing volleyball for 45 to 60 minutes.  Washing and waxing a car for 45 to 60  minutes.  Playing touch football for 45 minutes.  Walking 1  miles in 35 minutes.  Pushing a stroller 1  miles in 30 minutes.  Playing basketball for 30 minutes.  Raking leaves for 30 minutes.  Bicycling 5 miles in 30 minutes.  Walking 2 miles in 30 minutes.  Dancing for 30 minutes.  Shoveling snow for 15 minutes.  Swimming laps for 20 minutes.  Walking up stairs for 15 minutes.  Bicycling 4 miles in 15 minutes.  Gardening for 30 to 45 minutes.  Jumping rope for 15 minutes.  Washing windows or floors for 45 to 60 minutes. Document Released: 09/16/2010 Document Revised: 11/06/2011 Document Reviewed: 09/16/2010 ExitCare Patient Information 2013 [employer].   Other topics ( that may be useful information):    Sexually Transmitted Disease Sexually transmitted disease (STD) refers to any infection that is passed from person to person during sexual activity. This may happen by way of saliva, semen, blood, vaginal mucus, or urine. Common STDs include:  Gonorrhea.  Chlamydia.  Syphilis.  HIV/AIDS.  Genital herpes.  Hepatitis B and C.  Trichomonas.  Human papillomavirus (HPV).  Pubic lice. CAUSES  An STD may be spread by bacteria, virus, or parasite. A person can get an STD by:  Sexual intercourse with an infected person.  Sharing sex toys with an infected person.  Sharing needles with an infected person.  Having intimate contact with the genitals, mouth, or rectal areas of an infected person. SYMPTOMS  Some people may not have any symptoms, but   they can still pass the infection to others. Different STDs have different symptoms. Symptoms include:  Painful or bloody urination.  Pain in the pelvis, abdomen, vagina, anus, throat, or eyes.  Skin rash, itching, irritation, growths, or sores (lesions). These usually occur in the genital or anal area.  Abnormal vaginal discharge.  Penile discharge in men.  Soft, flesh-colored skin growths in the  genital or anal area.  Fever.  Pain or bleeding during sexual intercourse.  Swollen glands in the groin area.  Yellow skin and eyes (jaundice). This is seen with hepatitis. DIAGNOSIS  To make a diagnosis, your caregiver may:  Take a medical history.  Perform a physical exam.  Take a specimen (culture) to be examined.  Examine a sample of discharge under a microscope.  Perform blood test TREATMENT   Chlamydia, gonorrhea, trichomonas, and syphilis can be cured with antibiotic medicine.  Genital herpes, hepatitis, and HIV can be treated, but not cured, with prescribed medicines. The medicines will lessen the symptoms.  Genital warts from HPV can be treated with medicine or by freezing, burning (electrocautery), or surgery. Warts may come back.  HPV is a virus and cannot be cured with medicine or surgery.However, abnormal areas may be followed very closely by your caregiver and may be removed from the cervix, vagina, or vulva through office procedures or surgery. If your diagnosis is confirmed, your recent sexual partners need treatment. This is true even if they are symptom-free or have a negative culture or evaluation. They should not have sex until their caregiver says it is okay. HOME CARE INSTRUCTIONS  All sexual partners should be informed, tested, and treated for all STDs.  Take your antibiotics as directed. Finish them even if you start to feel better.  Only take over-the-counter or prescription medicines for pain, discomfort, or fever as directed by your caregiver.  Rest.  Eat a balanced diet and drink enough fluids to keep your urine clear or pale yellow.  Do not have sex until treatment is completed and you have followed up with your caregiver. STDs should be checked after treatment.  Keep all follow-up appointments, Pap tests, and blood tests as directed by your caregiver.  Only use latex condoms and water-soluble lubricants during sexual activity. Do not use  petroleum jelly or oils.  Avoid alcohol and illegal drugs.  Get vaccinated for HPV and hepatitis. If you have not received these vaccines in the past, talk to your caregiver about whether one or both might be right for you.  Avoid risky sex practices that can break the skin. The only way to avoid getting an STD is to avoid all sexual activity.Latex condoms and dental dams (for oral sex) will help lessen the risk of getting an STD, but will not completely eliminate the risk. SEEK MEDICAL CARE IF:   You have a fever.  You have any new or worsening symptoms. Document Released: 11/04/2002 Document Revised: 11/06/2011 Document Reviewed: 11/11/2010 [facility] Patient Information 2013 [employer].    Domestic Abuse You are being battered or abused if someone close to you hits, pushes, or physically hurts you in any way. You also are being abused if you are forced into activities. You are being sexually abused if you are forced to have sexual contact of any kind. You are being emotionally abused if you are made to feel worthless or if you are constantly threatened. It is important to remember that help is available. No one has the right to abuse you. PREVENTION OF FURTHER  ABUSE  Learn the warning signs of danger. This varies with situations but may include: the use of alcohol, threats, isolation from friends and family, or forced sexual contact. Leave if you feel that violence is going to occur.  If you are attacked or beaten, report it to the police so the abuse is documented. You do not have to press charges. The police can protect you while you or the attackers are leaving. Get the officer's name and badge number and a copy of the report.  Find someone you can trust and tell them what is happening to you: your caregiver, a nurse, clergy member, close friend or family member. Feeling ashamed is natural, but remember that you have done nothing wrong. No one deserves abuse. Document Released:  08/11/2000 Document Revised: 11/06/2011 Document Reviewed: 10/20/2010 ExitCare Patient Information 2013 [employer].    How Much is Too Much Alcohol? Drinking too much alcohol can cause injury, accidents, and health problems. These types of problems can include:   Car crashes.  Falls.  Family fighting (domestic violence).  Drowning.  Fights.  Injuries.  Burns.  Damage to certain organs.  Having a baby with birth defects. ONE DRINK CAN BE TOO MUCH WHEN YOU ARE:  Working.  Pregnant or breastfeeding.  Taking medicines. Ask your doctor.  Driving or planning to drive. If you or someone you know has a drinking problem, get help from a doctor.  Document Released: 06/10/2009 Document Revised: 11/06/2011 Document Reviewed: 06/10/2009 ExitCare Patient Information 2013 [employer].   Smoking Hazards Smoking cigarettes is extremely bad for your health. Tobacco smoke has over 200 known poisons in it. There are over 60 chemicals in tobacco smoke that cause cancer. Some of the chemicals found in cigarette smoke include:   Cyanide.  Benzene.  Formaldehyde.  Methanol (wood alcohol).  Acetylene (fuel used in welding torches).  Ammonia. Cigarette smoke also contains the poisonous gases nitrogen oxide and carbon monoxide.  Cigarette smokers have an increased risk of many serious medical problems and Smoking causes approximately:  90% of all lung cancer deaths in men.  80% of all lung cancer deaths in women.  90% of deaths from chronic obstructive lung disease. Compared with nonsmokers, smoking increases the risk of:  Coronary heart disease by 2 to 4 times.  Stroke by 2 to 4 times.  Men developing lung cancer by 23 times.  Women developing lung cancer by 13 times.  Dying from chronic obstructive lung diseases by 12 times.  . Smoking is the most preventable cause of death and disease in our society.  WHY IS SMOKING ADDICTIVE?  Nicotine is the chemical  agent in tobacco that is capable of causing addiction or dependence.  When you smoke and inhale, nicotine is absorbed rapidly into the bloodstream through your lungs. Nicotine absorbed through the lungs is capable of creating a powerful addiction. Both inhaled and non-inhaled nicotine may be addictive.  Addiction studies of cigarettes and spit tobacco show that addiction to nicotine occurs mainly during the teen years, when young people begin using tobacco products. WHAT ARE THE BENEFITS OF QUITTING?  There are many health benefits to quitting smoking.   Likelihood of developing cancer and heart disease decreases. Health improvements are seen almost immediately.  Blood pressure, pulse rate, and breathing patterns start returning to normal soon after quitting. QUITTING SMOKING   American Lung Association - 1-800-LUNGUSA  American Cancer Society - 1-800-ACS-2345 Document Released: 09/21/2004 Document Revised: 11/06/2011 Document Reviewed: 05/26/2009 ExitCare Patient Information 2013 ExitCare[employer]   LLC.   Stress Management Stress is a state of physical or mental tension that often results from changes in your life or normal routine. Some common causes of stress are:  Death of a loved one.  Injuries or severe illnesses.  Getting fired or changing jobs.  Moving into a new home. Other causes may be:  Sexual problems.  Business or financial losses.  Taking on a large debt.  Regular conflict with someone at home or at work.  Constant tiredness from lack of sleep. It is not just bad things that are stressful. It may be stressful to:  Win the lottery.  Get married.  Buy a new car. The amount of stress that can be easily tolerated varies from person to person. Changes generally cause stress, regardless of the types of change. Too much stress can affect your health. It may lead to physical or emotional problems. Too little stress (boredom) may also become stressful. SUGGESTIONS TO  REDUCE STRESS:  Talk things over with your family and friends. It often is helpful to share your concerns and worries. If you feel your problem is serious, you may want to get help from a professional counselor.  Consider your problems one at a time instead of lumping them all together. Trying to take care of everything at once may seem impossible. List all the things you need to do and then start with the most important one. Set a goal to accomplish 2 or 3 things each day. If you expect to do too many in a single day you will naturally fail, causing you to feel even more stressed.  Do not use alcohol or drugs to relieve stress. Although you may feel better for a short time, they do not remove the problems that caused the stress. They can also be habit forming.  Exercise regularly - at least 3 times per week. Physical exercise can help to relieve that "uptight" feeling and will relax you.  The shortest distance between despair and hope is often a good night's sleep.  Go to bed and get up on time allowing yourself time for appointments without being rushed.  Take a short "time-out" period from any stressful situation that occurs during the day. Close your eyes and take some deep breaths. Starting with the muscles in your face, tense them, hold it for a few seconds, then relax. Repeat this with the muscles in your neck, shoulders, hand, stomach, back and legs.  Take good care of yourself. Eat a balanced diet and get plenty of rest.  Schedule time for having fun. Take a break from your daily routine to relax. HOME CARE INSTRUCTIONS   Call if you feel overwhelmed by your problems and feel you can no longer manage them on your own.  Return immediately if you feel like hurting yourself or someone else. Document Released: 02/07/2001 Document Revised: 11/06/2011 Document Reviewed: 09/30/2007 ExitCare Patient Information 2013 ExitCare[employer].   

## 2017-12-05 NOTE — Progress Notes (Signed)
22 y.o. G0P0000 Single  Caucasian Fe here for annual exam. Periods scant to none. Depo Provera working well. Sexually active,  No partner change or STD screening. Migraine headaches still occur, but no change in intensity or frequency and no aura. Sees MD if needed. Will be graduating with masters soon. No other health issues today..  No LMP recorded. Patient has had an injection.          Sexually active: Yes.    The current method of family planning is Depo-Provera injections.    Exercising: Yes.    depo provera Smoker:  no  Health Maintenance: Pap:  11-17-15 neg History of Abnormal Pap: no MMG:  none Self Breast exams: sometimes Colonoscopy: 2014 or 2015 with issues taking Omnicef which caused GI bleeding  normal  BMD:   none TDaP:  2018 Shingles: no Pneumonia: no Hep C and HIV: HIV neg 2017 Labs: if needed   reports that she has never smoked. She has never used smokeless tobacco. She reports that she drinks about 1.8 oz of alcohol per week. She reports that she does not use drugs.  Past Medical History:  Diagnosis Date  . Allergy   . Irregular menses since age 33  . Migraine headache without aura since 7th grade    Past Surgical History:  Procedure Laterality Date  . COSMETIC SURGERY    . KNEE SURGERY Left 12/2010   patella realignment and lateral release  . MOLE REMOVAL     left ear and right leg, performed by plastic surgeon under anesthesia  . PATELLAR TENDON REPAIR Right 09/2015  . WISDOM TOOTH EXTRACTION  February 2016    Current Outpatient Medications  Medication Sig Dispense Refill  . fluocinonide (LIDEX) 0.05 % external solution APPLY TO AFFECTED AREA TWICE A DAY  1  . ibuprofen (ADVIL,MOTRIN) 600 MG tablet Take by mouth.    Marland Kitchen ketoconazole (NIZORAL) 2 % shampoo AS DIRECTED ONCE TO TWICE A WEEK EXTERNALLY 30 DAYS  2  . medroxyPROGESTERone (DEPO-PROVERA) 150 MG/ML injection Inject 1 mL (150 mg total) into the muscle every 3 (three) months. 1 mL 3  . naproxen  (NAPROSYN) 500 MG tablet Take 1 tablet (500 mg total) by mouth 2 (two) times daily as needed for mild pain, moderate pain or headache (TAKE WITH MEALS.). 20 tablet 0  . oxybutynin (DITROPAN-XL) 10 MG 24 hr tablet Take 1 tablet by mouth daily.  11  . tretinoin (RETIN-A) 0.025 % cream Apply 1 application topically daily.  3  . celecoxib (CELEBREX) 200 MG capsule Take by mouth.     No current facility-administered medications for this visit.     Family History  Problem Relation Age of Onset  . COPD Maternal Grandfather     ROS:  Pertinent items are noted in HPI.  Otherwise, a comprehensive ROS was negative.  Exam:   BP 106/62   Pulse 72   Resp 16   Ht 5' 5.25" (1.657 m)   Wt 177 lb (80.3 kg)   BMI 29.23 kg/m  Height: 5' 5.25" (165.7 cm) Ht Readings from Last 3 Encounters:  12/05/17 5' 5.25" (1.657 m)  11/14/17 5' 5.25" (1.657 m)  06/12/17 5' 5.25" (1.657 m)    General appearance: alert, cooperative and appears stated age Head: Normocephalic, without obvious abnormality, atraumatic Neck: no adenopathy, supple, symmetrical, trachea midline and thyroid normal to inspection and palpation Lungs: clear to auscultation bilaterally Breasts: normal appearance, no masses or tenderness, No nipple retraction or dimpling, No nipple  discharge or bleeding, No axillary or supraclavicular adenopathy Heart: regular rate and rhythm Abdomen: soft, non-tender; no masses,  no organomegaly Extremities: extremities normal, atraumatic, no cyanosis or edema Skin: Skin color, texture, turgor normal. No rashes or lesions Lymph nodes: Cervical, supraclavicular, and axillary nodes normal. No abnormal inguinal nodes palpated Neurologic: Grossly normal   Pelvic: External genitalia:  no lesions              Urethra:  normal appearing urethra with no masses, tenderness or lesions              Bartholin's and Skene's: normal                 Vagina: normal appearing vagina with normal color and discharge, no  lesions              Cervix: no cervical motion tenderness, no lesions and nulliparous appearance              Pap taken: No. Bimanual Exam:  Uterus:  normal size, contour, position, consistency, mobility, non-tender and anteverted              Adnexa: normal adnexa and no mass, fullness, tenderness               Rectovaginal: Confirms               Anus:  normal appearance  Chaperone present: yes  A:  Well Woman with normal exam  Contraception Depo Provera desired, not due today  Migraine headache history no aura or headache change  P:   Reviewed health and wellness pertinent to exam  Risks/benefits/warning signs and weight management discussed with use, expectations with period, desires continuance  Rx Depo Provera 150 mg every 3 months x 4  Pap smear: no   counseled on breast self exam, STD prevention, HIV risk factors and prevention, adequate intake of calcium and vitamin D, diet and exercise  return annually or prn  An After Visit Summary was printed and given to the patient.

## 2018-02-04 ENCOUNTER — Other Ambulatory Visit: Payer: Self-pay

## 2018-02-04 ENCOUNTER — Ambulatory Visit (INDEPENDENT_AMBULATORY_CARE_PROVIDER_SITE_OTHER): Payer: BLUE CROSS/BLUE SHIELD | Admitting: *Deleted

## 2018-02-04 VITALS — BP 110/60 | HR 84 | Resp 14 | Ht 65.0 in | Wt 176.0 lb

## 2018-02-04 DIAGNOSIS — Z3042 Encounter for surveillance of injectable contraceptive: Secondary | ICD-10-CM

## 2018-02-04 MED ORDER — MEDROXYPROGESTERONE ACETATE 150 MG/ML IM SUSP
150.0000 mg | Freq: Once | INTRAMUSCULAR | Status: AC
  Administered 2018-02-04: 150 mg via INTRAMUSCULAR

## 2018-02-04 NOTE — Progress Notes (Signed)
Patient is here for Depo Provera Injection Patient is within Depo Provera Calender Limits yes, last given 11/14/17 Next Depo Due between: 8/26-9/9  Last AEX: 12/05/17 DL  AEX Scheduled: 6/57/844/14/20   Patient is aware when next depo is due  Pt tolerated Injection well in RUOQ.  Routed to provider for review, encounter closed.

## 2018-02-12 DIAGNOSIS — F4320 Adjustment disorder, unspecified: Secondary | ICD-10-CM | POA: Diagnosis not present

## 2018-04-09 DIAGNOSIS — L309 Dermatitis, unspecified: Secondary | ICD-10-CM | POA: Diagnosis not present

## 2018-04-23 ENCOUNTER — Telehealth: Payer: Self-pay | Admitting: Certified Nurse Midwife

## 2018-04-23 ENCOUNTER — Ambulatory Visit (INDEPENDENT_AMBULATORY_CARE_PROVIDER_SITE_OTHER): Payer: BLUE CROSS/BLUE SHIELD

## 2018-04-23 VITALS — BP 110/62 | HR 77 | Resp 14 | Ht 65.25 in | Wt 169.2 lb

## 2018-04-23 DIAGNOSIS — Z308 Encounter for other contraceptive management: Secondary | ICD-10-CM

## 2018-04-23 MED ORDER — MEDROXYPROGESTERONE ACETATE 150 MG/ML IM SUSP
150.0000 mg | Freq: Once | INTRAMUSCULAR | Status: AC
  Administered 2018-04-23: 150 mg via INTRAMUSCULAR

## 2018-04-23 NOTE — Progress Notes (Signed)
Patient is here for Depo Provera Injection Patient is within Depo Provera Calender Limits Last given 02/04/18  Next Depo Due between: Nov 11- 25  Last AEX: 12/05/17  AEX Scheduled: 12/10/18  Patient is aware when next depo is due  Pt tolerated Injection well LUOQ Routed to provider for review, encounter closed.

## 2018-04-23 NOTE — Telephone Encounter (Signed)
Left message regarding depo appointment that was scheduled 07/08/2018 at 8:30am. Patient is due 07/09/18-07/23/18, I moved her appointment to 07/09/18 at 8:30 and asked the patient to call if this would not work for her, otherwise we would see her 07/09/18 at 8:30am.

## 2018-05-23 ENCOUNTER — Ambulatory Visit: Payer: BLUE CROSS/BLUE SHIELD

## 2018-07-04 NOTE — Progress Notes (Signed)
Patient is here for Depo Provera Injection Patient is within Depo Provera Calender Limits yes last given 04/23/18 Next Depo Due between: Jan 28- Feb 11 Last AEX: 12/05/17 AEX Scheduled: 12/10/18  Patient is aware when next depo is due  Pt tolerated Injection well. RUOQ  Routed to provider for review, encounter closed.

## 2018-07-08 ENCOUNTER — Ambulatory Visit: Payer: BLUE CROSS/BLUE SHIELD

## 2018-07-09 ENCOUNTER — Ambulatory Visit (INDEPENDENT_AMBULATORY_CARE_PROVIDER_SITE_OTHER): Payer: BLUE CROSS/BLUE SHIELD

## 2018-07-09 VITALS — BP 130/64 | HR 68 | Resp 14 | Ht 62.0 in | Wt 168.0 lb

## 2018-07-09 DIAGNOSIS — Z3042 Encounter for surveillance of injectable contraceptive: Secondary | ICD-10-CM

## 2018-07-09 MED ORDER — MEDROXYPROGESTERONE ACETATE 150 MG/ML IM SUSP
150.0000 mg | Freq: Once | INTRAMUSCULAR | Status: AC
  Administered 2018-07-09: 150 mg via INTRAMUSCULAR

## 2018-08-27 ENCOUNTER — Ambulatory Visit: Payer: BLUE CROSS/BLUE SHIELD | Admitting: Podiatry

## 2018-08-27 ENCOUNTER — Encounter: Payer: Self-pay | Admitting: Podiatry

## 2018-08-27 VITALS — BP 121/81 | HR 76

## 2018-08-27 DIAGNOSIS — L600 Ingrowing nail: Secondary | ICD-10-CM

## 2018-08-29 DIAGNOSIS — D2230 Melanocytic nevi of unspecified part of face: Secondary | ICD-10-CM | POA: Diagnosis not present

## 2018-08-29 DIAGNOSIS — L219 Seborrheic dermatitis, unspecified: Secondary | ICD-10-CM | POA: Diagnosis not present

## 2018-08-29 DIAGNOSIS — L814 Other melanin hyperpigmentation: Secondary | ICD-10-CM | POA: Diagnosis not present

## 2018-08-29 DIAGNOSIS — Z86018 Personal history of other benign neoplasm: Secondary | ICD-10-CM | POA: Diagnosis not present

## 2018-08-30 NOTE — Progress Notes (Signed)
   Subjective: Patient presents today for evaluation of pain to the medial border of the left hallux that began 2-3 days ago. She states the nail came off 2-3 days ago and there is a small piece left in the medial corner. She reports associated purulent drainage. She has not done anything for treatment. Applying pressure to the toe increases the pain. Patient presents today for further treatment and evaluation.  Past Medical History:  Diagnosis Date  . Allergy   . Irregular menses since age 59  . Migraine headache without aura since 7th grade    Objective:  General: Well developed, nourished, in no acute distress, alert and oriented x3   Dermatology: Skin is warm, dry and supple bilateral. Medial border of the left hallux appears to be erythematous with evidence of an ingrowing nail. Pain on palpation noted to the border of the nail fold. The remaining nails appear unremarkable at this time. There are no open sores, lesions.  Vascular: Dorsalis Pedis artery and Posterior Tibial artery pedal pulses palpable. No lower extremity edema noted.   Neruologic: Grossly intact via light touch bilateral.  Musculoskeletal: Muscular strength within normal limits in all groups bilateral. Normal range of motion noted to all pedal and ankle joints.   Assesement: #1 Paronychia with ingrowing nail medial border left hallux  #2 Pain in toe #3 Incurvated nail  Plan of Care:  1. Patient evaluated.  2. Discussed treatment alternatives and plan of care. Explained nail avulsion procedure and post procedure course to patient. 3. Patient opted for temporary partial nail avulsion of the medial border of the left hallux.  4. Prior to procedure, local anesthesia infiltration utilized using 3 ml of a 50:50 mixture of 2% plain lidocaine and 0.5% plain marcaine in a normal hallux block fashion and a betadine prep performed.  5. Light dressing applied. 6. Return to clinic as needed.Felecia Shelling, DPM [facility]  Dr. Felecia Shelling, DPM    [address]                                        [place], [place] [address]                Office [phone]  Fax [phone]

## 2018-09-24 ENCOUNTER — Ambulatory Visit (INDEPENDENT_AMBULATORY_CARE_PROVIDER_SITE_OTHER): Payer: BLUE CROSS/BLUE SHIELD

## 2018-09-24 VITALS — BP 118/64 | HR 66 | Resp 14 | Ht 65.0 in | Wt 171.0 lb

## 2018-09-24 DIAGNOSIS — Z3042 Encounter for surveillance of injectable contraceptive: Secondary | ICD-10-CM

## 2018-09-24 MED ORDER — MEDROXYPROGESTERONE ACETATE 150 MG/ML IM SUSP
150.0000 mg | Freq: Once | INTRAMUSCULAR | Status: AC
  Administered 2018-09-24: 150 mg via INTRAMUSCULAR

## 2018-09-24 NOTE — Progress Notes (Signed)
Patient is here for Depo Provera Injection Patient is within Depo Provera Calender Limits Yes last given 07/09/18 Next Depo Due between: 4/15-4/29 Last AEX: 12/05/17 AEX Scheduled: 12/10/18  Patient is aware when next depo is due  Pt tolerated Injection well LUOQ  Routed to provider for review, encounter closed.

## 2018-12-10 ENCOUNTER — Ambulatory Visit: Payer: BLUE CROSS/BLUE SHIELD | Admitting: Certified Nurse Midwife

## 2018-12-11 ENCOUNTER — Ambulatory Visit: Payer: BLUE CROSS/BLUE SHIELD

## 2018-12-12 ENCOUNTER — Other Ambulatory Visit: Payer: Self-pay

## 2018-12-12 ENCOUNTER — Ambulatory Visit (INDEPENDENT_AMBULATORY_CARE_PROVIDER_SITE_OTHER): Payer: BLUE CROSS/BLUE SHIELD | Admitting: Obstetrics and Gynecology

## 2018-12-12 ENCOUNTER — Ambulatory Visit: Payer: BLUE CROSS/BLUE SHIELD | Admitting: Certified Nurse Midwife

## 2018-12-12 VITALS — BP 108/62 | HR 76 | Wt 168.2 lb

## 2018-12-12 DIAGNOSIS — Z3042 Encounter for surveillance of injectable contraceptive: Secondary | ICD-10-CM | POA: Diagnosis not present

## 2018-12-12 MED ORDER — MEDROXYPROGESTERONE ACETATE 150 MG/ML IM SUSP
150.0000 mg | Freq: Once | INTRAMUSCULAR | Status: AC
  Administered 2018-12-12: 150 mg via INTRAMUSCULAR

## 2018-12-12 NOTE — Progress Notes (Signed)
Patient is here for Depo Provera Injection Patient is within Depo Provera Calender Limits: yes last depo 09/24/18 Next Depo Due between: 7/2 - 7/16 Last AEX: 12/05/17 DL AEX Scheduled: none  Patient is aware when next depo is due.  Pt tolerated Injection well. RUOQ today.   Routed to provider for review, encounter closed.

## 2019-02-12 NOTE — Progress Notes (Signed)
23 y.o. G0P0000 Single  Caucasian Fe here for annual exam. Periods none with Depo Provera, happy with choice. Has lost 28 pounds with exercise and eating better too. Still having furlough from job at this point. Emotionally coping. No partner change and no STD screening needed. Sees dermatology for skin management. No health issues today.  No LMP recorded. Patient has had an injection.          Sexually active: Yes.    The current method of family planning is Depo-Provera injections.    Exercising: Yes.    treadmill Smoker:  no  Review of Systems  Constitutional: Negative.   HENT: Negative.   Eyes: Negative.   Respiratory: Negative.   Cardiovascular: Negative.   Gastrointestinal: Negative.   Genitourinary: Negative.   Musculoskeletal: Negative.   Skin: Negative.   Neurological: Negative.   Endo/Heme/Allergies: Negative.   Psychiatric/Behavioral: Negative.     Health Maintenance: Pap:  11-17-15 neg History of Abnormal Pap: no MMG:  none Self Breast exams: yes Colonoscopy:  2014 or 2015, issues with taking Omnicef which caused GI bleeding normal BMD:   none TDaP:  2018 Shingles: no Pneumonia: no Hep C and HIV: HIV neg 2017 Labs: no   reports that she has never smoked. She has never used smokeless tobacco. She reports current alcohol use of about 3.0 standard drinks of alcohol per week. She reports that she does not use drugs.  Past Medical History:  Diagnosis Date  . Allergy   . Irregular menses since age 23  . Migraine headache without aura since 7th grade    Past Surgical History:  Procedure Laterality Date  . COSMETIC SURGERY    . KNEE SURGERY Left 12/2010   patella realignment and lateral release  . MOLE REMOVAL     left ear and right leg, performed by plastic surgeon under anesthesia  . PATELLAR TENDON REPAIR Right 09/2015  . WISDOM TOOTH EXTRACTION  February 2016    Current Outpatient Medications  Medication Sig Dispense Refill  . fluocinonide (LIDEX) 0.05 %  external solution APPLY TO AFFECTED AREA TWICE A DAY  1  . ibuprofen (ADVIL,MOTRIN) 600 MG tablet Take by mouth.    Marland Kitchen. ketoconazole (NIZORAL) 2 % shampoo AS DIRECTED ONCE TO TWICE A WEEK EXTERNALLY 30 DAYS  2  . medroxyPROGESTERone (DEPO-PROVERA) 150 MG/ML injection Inject 1 mL (150 mg total) into the muscle every 3 (three) months. 1 mL 3  . naproxen (NAPROSYN) 500 MG tablet Take 1 tablet (500 mg total) by mouth 2 (two) times daily as needed for mild pain, moderate pain or headache (TAKE WITH MEALS.). (Patient not taking: Reported on 08/27/2018) 20 tablet 0  . oxybutynin (DITROPAN-XL) 10 MG 24 hr tablet Take 1 tablet by mouth daily.  11  . tretinoin (RETIN-A) 0.025 % cream Apply 1 application topically daily.  3   No current facility-administered medications for this visit.     Family History  Problem Relation Age of Onset  . COPD Maternal Grandfather     ROS:  Pertinent items are noted in HPI.  Otherwise, a comprehensive ROS was negative.  Exam:   There were no vitals taken for this visit.   Ht Readings from Last 3 Encounters:  09/24/18 5\' 5"  (1.651 m)  07/09/18 5\' 2"  (1.575 m)  04/23/18 5' 5.25" (1.657 m)    General appearance: alert, cooperative and appears stated age Head: Normocephalic, without obvious abnormality, atraumatic Neck: no adenopathy, supple, symmetrical, trachea midline and thyroid normal to inspection  and palpation Lungs: clear to auscultation bilaterally Breasts: normal appearance, no masses or tenderness, No nipple retraction or dimpling, No nipple discharge or bleeding, No axillary or supraclavicular adenopathy Heart: regular rate and rhythm Abdomen: soft, non-tender; no masses,  no organomegaly Extremities: extremities normal, atraumatic, no cyanosis or edema Skin: Skin color, texture, turgor normal. No rashes or lesions Lymph nodes: Cervical, supraclavicular, and axillary nodes normal. No abnormal inguinal nodes palpated Neurologic: Grossly  normal   Pelvic: External genitalia:  no lesions,              Urethra:  normal appearing urethra with no masses, tenderness or lesions              Bartholin's and Skene's: normal                 Vagina: normal appearing vagina with normal color and discharge, no lesions              Cervix: no cervical motion tenderness, no lesions and nulliparous appearance              Pap taken: Yes.   Bimanual Exam:  Uterus:  normal size, contour, position, consistency, mobility, non-tender and anteverted              Adnexa: normal adnexa and no mass, fullness, tenderness               Rectovaginal: Confirms               Anus:  normal appearance  Chaperone present: yes  A:  Well Woman with normal exam  Contraception Depo Provera desired  Urinary urgency at times with MD management    P:   Reviewed health and wellness pertinent to exam  Risks/benefits/warning signs, bleeding expectations and weight changes reviewed, request continuance  Rx Depo Provera 150 mg IM every 3 months x one year  Continue MD follow up as indicated  Pap smear: yes   counseled on breast self exam, STD prevention, HIV risk factors and prevention, feminine hygiene, adequate intake of calcium and vitamin D, diet and exercise  return annually or prn  An After Visit Summary was printed and given to the patient.

## 2019-02-13 ENCOUNTER — Other Ambulatory Visit: Payer: Self-pay

## 2019-02-14 ENCOUNTER — Encounter: Payer: Self-pay | Admitting: Certified Nurse Midwife

## 2019-02-14 ENCOUNTER — Ambulatory Visit (INDEPENDENT_AMBULATORY_CARE_PROVIDER_SITE_OTHER): Payer: BC Managed Care – PPO | Admitting: Certified Nurse Midwife

## 2019-02-14 ENCOUNTER — Other Ambulatory Visit (HOSPITAL_COMMUNITY)
Admission: RE | Admit: 2019-02-14 | Discharge: 2019-02-14 | Disposition: A | Discharge status: Home / Self Care | Payer: BC Managed Care – PPO | Source: Ambulatory Visit | Attending: Certified Nurse Midwife | Admitting: Certified Nurse Midwife

## 2019-02-14 ENCOUNTER — Other Ambulatory Visit: Payer: Self-pay

## 2019-02-14 VITALS — BP 110/70 | HR 70 | Temp 97.8°F | Resp 16 | Ht 65.25 in | Wt 149.0 lb

## 2019-02-14 DIAGNOSIS — Z3042 Encounter for surveillance of injectable contraceptive: Secondary | ICD-10-CM | POA: Diagnosis not present

## 2019-02-14 DIAGNOSIS — Z124 Encounter for screening for malignant neoplasm of cervix: Secondary | ICD-10-CM

## 2019-02-14 DIAGNOSIS — Z01419 Encounter for gynecological examination (general) (routine) without abnormal findings: Secondary | ICD-10-CM

## 2019-02-18 LAB — CYTOLOGY - PAP
Diagnosis: NEGATIVE
HPV: NOT DETECTED

## 2019-02-27 ENCOUNTER — Ambulatory Visit (INDEPENDENT_AMBULATORY_CARE_PROVIDER_SITE_OTHER): Payer: BC Managed Care – PPO

## 2019-02-27 ENCOUNTER — Other Ambulatory Visit: Payer: Self-pay

## 2019-02-27 VITALS — BP 118/64 | HR 60 | Resp 14 | Ht 65.25 in | Wt 147.2 lb

## 2019-02-27 DIAGNOSIS — Z3042 Encounter for surveillance of injectable contraceptive: Secondary | ICD-10-CM | POA: Diagnosis not present

## 2019-02-27 MED ORDER — MEDROXYPROGESTERONE ACETATE 150 MG/ML IM SUSP
150.0000 mg | Freq: Once | INTRAMUSCULAR | Status: AC
  Administered 2019-02-27: 150 mg via INTRAMUSCULAR

## 2019-02-27 NOTE — Progress Notes (Signed)
Patient is here for Depo Provera Injection Patient is within Depo Provera Calender Limits yes last given 12/12/18 Next Depo Due between: sept 17-oct 1 Last AEX: 02/14/19 DL AEX Scheduled: nothing scheduled at this time   Patient is aware when next depo is due   Pt tolerated Injection well LUOQ.  Routed to provider for review, encounter closed.

## 2019-05-15 ENCOUNTER — Other Ambulatory Visit: Payer: Self-pay

## 2019-05-15 ENCOUNTER — Ambulatory Visit (INDEPENDENT_AMBULATORY_CARE_PROVIDER_SITE_OTHER): Payer: BC Managed Care – PPO

## 2019-05-15 VITALS — BP 118/72 | HR 80 | Temp 97.4°F | Ht 65.25 in | Wt 139.4 lb

## 2019-05-15 DIAGNOSIS — Z3042 Encounter for surveillance of injectable contraceptive: Secondary | ICD-10-CM

## 2019-05-15 MED ORDER — MEDROXYPROGESTERONE ACETATE 150 MG/ML IM SUSP
150.0000 mg | Freq: Once | INTRAMUSCULAR | Status: AC
Start: 2019-05-15 — End: 2019-05-15
  Administered 2019-05-15: 150 mg via INTRAMUSCULAR

## 2019-05-15 NOTE — Progress Notes (Signed)
Patient is here for Depo Provera Injection Patient is within Depo Provera Calender Limits yes Next Depo Due between: 05/15/19 to 05/29/19 Last AEX:02-14-19 DL AEX Scheduled: not scheduled  Patient is aware when next depo is due between12-3-20 and 08-14-19.      Pt tolerated Injection well in RUQ.  Routed to provider for review, encounter closed.

## 2019-07-31 ENCOUNTER — Ambulatory Visit: Payer: BC Managed Care – PPO

## 2019-08-05 NOTE — Progress Notes (Signed)
Patient is here for Depo Provera Injection Patient is within Depo Provera Calender Limits 12/10-12/24/20 Next Depo Due between: Feb 25- November 06 2019 Last AEX: 02/14/19 with DL AEX Scheduled: 02/16/2020  Patient is aware when next depo is due  Pt tolerated Injection well. Given in [place]  Routed to provider for review, encounter closed.

## 2019-08-07 ENCOUNTER — Other Ambulatory Visit: Payer: Self-pay

## 2019-08-07 ENCOUNTER — Ambulatory Visit (INDEPENDENT_AMBULATORY_CARE_PROVIDER_SITE_OTHER): Payer: BC Managed Care – PPO | Admitting: Obstetrics and Gynecology

## 2019-08-07 VITALS — BP 110/65 | HR 82 | Temp 97.3°F | Ht 65.0 in | Wt 137.8 lb

## 2019-08-07 DIAGNOSIS — Z3042 Encounter for surveillance of injectable contraceptive: Secondary | ICD-10-CM | POA: Diagnosis not present

## 2019-08-07 MED ORDER — MEDROXYPROGESTERONE ACETATE 150 MG/ML IM SUSP
150.0000 mg | Freq: Once | INTRAMUSCULAR | Status: AC
  Administered 2019-08-07: 16:00:00 150 mg via INTRAMUSCULAR

## 2019-09-02 DIAGNOSIS — D2230 Melanocytic nevi of unspecified part of face: Secondary | ICD-10-CM | POA: Diagnosis not present

## 2019-09-02 DIAGNOSIS — D225 Melanocytic nevi of trunk: Secondary | ICD-10-CM | POA: Diagnosis not present

## 2019-09-02 DIAGNOSIS — L814 Other melanin hyperpigmentation: Secondary | ICD-10-CM | POA: Diagnosis not present

## 2019-09-02 DIAGNOSIS — Z86018 Personal history of other benign neoplasm: Secondary | ICD-10-CM | POA: Diagnosis not present

## 2019-10-23 ENCOUNTER — Ambulatory Visit (INDEPENDENT_AMBULATORY_CARE_PROVIDER_SITE_OTHER): Payer: BC Managed Care – PPO | Admitting: Obstetrics and Gynecology

## 2019-10-23 ENCOUNTER — Other Ambulatory Visit: Payer: Self-pay

## 2019-10-23 VITALS — BP 110/60 | HR 70 | Temp 98.2°F | Ht 66.0 in | Wt 140.0 lb

## 2019-10-23 DIAGNOSIS — Z3042 Encounter for surveillance of injectable contraceptive: Secondary | ICD-10-CM

## 2019-10-23 MED ORDER — MEDROXYPROGESTERONE ACETATE 150 MG/ML IM SUSP
150.0000 mg | Freq: Once | INTRAMUSCULAR | Status: AC
  Administered 2019-10-23: 09:00:00 150 mg via INTRAMUSCULAR

## 2019-10-23 NOTE — Progress Notes (Signed)
Patient is here for Depo Provera Injection Patient is within Depo Provera Calender Limits 2/25-3/06/2020 Next Depo Due between: 5/13-5/27/2021 Last AEX: 02/14/19 with DL AEX Scheduled: 09/29/3341, cancelled.   Patient is aware when next depo is due  Pt tolerated Injection well in RUOQ.  Routed to provider for review, encounter closed.  Pt states moving to [place] on 11/15/2019 for fiance's job. Pt given next depo dates and will find new OBGYN.

## 2019-11-10 ENCOUNTER — Encounter: Payer: Self-pay | Admitting: Certified Nurse Midwife

## 2019-11-12 ENCOUNTER — Encounter: Payer: Self-pay | Admitting: Certified Nurse Midwife

## 2020-02-16 ENCOUNTER — Ambulatory Visit: Payer: Self-pay | Admitting: Certified Nurse Midwife
